# Patient Record
Sex: Female | Born: 1945
Health system: Southern US, Community
[De-identification: ages and names within clinical notes are randomized; demographics above are authoritative.]

## PROBLEM LIST (undated history)

## (undated) DIAGNOSIS — R112 Nausea with vomiting, unspecified: Secondary | ICD-10-CM

## (undated) DIAGNOSIS — R51 Headache: Secondary | ICD-10-CM

## (undated) DIAGNOSIS — K219 Gastro-esophageal reflux disease without esophagitis: Secondary | ICD-10-CM

## (undated) DIAGNOSIS — J45909 Unspecified asthma, uncomplicated: Secondary | ICD-10-CM

## (undated) DIAGNOSIS — Z9889 Other specified postprocedural states: Secondary | ICD-10-CM

## (undated) DIAGNOSIS — I10 Essential (primary) hypertension: Secondary | ICD-10-CM

## (undated) DIAGNOSIS — Z973 Presence of spectacles and contact lenses: Secondary | ICD-10-CM

## (undated) DIAGNOSIS — F32A Depression, unspecified: Secondary | ICD-10-CM

## (undated) DIAGNOSIS — F329 Major depressive disorder, single episode, unspecified: Secondary | ICD-10-CM

## (undated) DIAGNOSIS — M1990 Unspecified osteoarthritis, unspecified site: Secondary | ICD-10-CM

## (undated) DIAGNOSIS — F419 Anxiety disorder, unspecified: Secondary | ICD-10-CM

## (undated) DIAGNOSIS — E039 Hypothyroidism, unspecified: Secondary | ICD-10-CM

## (undated) HISTORY — PX: TONSILLECTOMY: SUR1361

## (undated) HISTORY — PX: APPENDECTOMY: SHX54

## (undated) HISTORY — PX: COLONOSCOPY: SHX174

---

## 1974-11-29 HISTORY — PX: TUBAL LIGATION: SHX77

## 1979-11-30 HISTORY — PX: ABDOMINAL HYSTERECTOMY: SHX81

## 1993-11-29 HISTORY — PX: OVARY SURGERY: SHX727

## 1998-10-10 ENCOUNTER — Other Ambulatory Visit: Admission: RE | Admit: 1998-10-10 | Discharge: 1998-10-10 | Payer: Self-pay | Admitting: Obstetrics & Gynecology

## 1999-08-28 ENCOUNTER — Ambulatory Visit (HOSPITAL_COMMUNITY): Admission: RE | Admit: 1999-08-28 | Discharge: 1999-08-28 | Payer: Self-pay | Admitting: Gastroenterology

## 1999-09-01 ENCOUNTER — Ambulatory Visit (HOSPITAL_COMMUNITY): Admission: RE | Admit: 1999-09-01 | Discharge: 1999-09-01 | Payer: Self-pay | Admitting: Gastroenterology

## 1999-09-01 ENCOUNTER — Encounter: Payer: Self-pay | Admitting: Gastroenterology

## 1999-09-04 ENCOUNTER — Inpatient Hospital Stay (HOSPITAL_COMMUNITY): Admission: AD | Admit: 1999-09-04 | Discharge: 1999-09-06 | Payer: Self-pay | Admitting: Cardiovascular Disease

## 1999-09-04 ENCOUNTER — Encounter: Payer: Self-pay | Admitting: Cardiovascular Disease

## 1999-12-10 ENCOUNTER — Other Ambulatory Visit: Admission: RE | Admit: 1999-12-10 | Discharge: 1999-12-10 | Payer: Self-pay | Admitting: Obstetrics & Gynecology

## 2000-02-22 ENCOUNTER — Encounter (INDEPENDENT_AMBULATORY_CARE_PROVIDER_SITE_OTHER): Payer: Self-pay | Admitting: *Deleted

## 2000-02-22 ENCOUNTER — Ambulatory Visit (HOSPITAL_BASED_OUTPATIENT_CLINIC_OR_DEPARTMENT_OTHER): Admission: RE | Admit: 2000-02-22 | Discharge: 2000-02-22 | Payer: Self-pay | Admitting: Surgery

## 2000-08-23 ENCOUNTER — Ambulatory Visit (HOSPITAL_COMMUNITY): Admission: RE | Admit: 2000-08-23 | Discharge: 2000-08-23 | Payer: Self-pay | Admitting: Neurology

## 2001-01-23 ENCOUNTER — Other Ambulatory Visit: Admission: RE | Admit: 2001-01-23 | Discharge: 2001-01-23 | Payer: Self-pay | Admitting: Obstetrics & Gynecology

## 2001-05-29 ENCOUNTER — Encounter: Admission: RE | Admit: 2001-05-29 | Discharge: 2001-05-29 | Payer: Self-pay | Admitting: Cardiovascular Disease

## 2001-05-29 ENCOUNTER — Encounter: Payer: Self-pay | Admitting: Cardiovascular Disease

## 2001-09-11 ENCOUNTER — Ambulatory Visit (HOSPITAL_COMMUNITY): Admission: RE | Admit: 2001-09-11 | Discharge: 2001-09-11 | Payer: Self-pay | Admitting: Gastroenterology

## 2002-04-06 ENCOUNTER — Encounter: Payer: Self-pay | Admitting: Cardiovascular Disease

## 2002-04-06 ENCOUNTER — Encounter: Admission: RE | Admit: 2002-04-06 | Discharge: 2002-04-06 | Payer: Self-pay | Admitting: Cardiovascular Disease

## 2002-06-26 ENCOUNTER — Encounter: Admission: RE | Admit: 2002-06-26 | Discharge: 2002-06-26 | Payer: Self-pay | Admitting: Cardiovascular Disease

## 2002-06-26 ENCOUNTER — Encounter: Payer: Self-pay | Admitting: Cardiovascular Disease

## 2002-08-20 ENCOUNTER — Other Ambulatory Visit: Admission: RE | Admit: 2002-08-20 | Discharge: 2002-08-20 | Payer: Self-pay | Admitting: Obstetrics & Gynecology

## 2003-01-22 ENCOUNTER — Emergency Department (HOSPITAL_COMMUNITY): Admission: EM | Admit: 2003-01-22 | Discharge: 2003-01-22 | Payer: Self-pay | Admitting: Emergency Medicine

## 2003-01-22 ENCOUNTER — Encounter: Payer: Self-pay | Admitting: Emergency Medicine

## 2003-09-13 ENCOUNTER — Other Ambulatory Visit: Admission: RE | Admit: 2003-09-13 | Discharge: 2003-09-13 | Payer: Self-pay | Admitting: Obstetrics & Gynecology

## 2003-09-18 ENCOUNTER — Ambulatory Visit (HOSPITAL_COMMUNITY): Admission: RE | Admit: 2003-09-18 | Discharge: 2003-09-18 | Payer: Self-pay | Admitting: Gastroenterology

## 2005-04-27 ENCOUNTER — Inpatient Hospital Stay (HOSPITAL_COMMUNITY): Admission: AD | Admit: 2005-04-27 | Discharge: 2005-04-29 | Payer: Self-pay | Admitting: Cardiovascular Disease

## 2005-06-16 ENCOUNTER — Other Ambulatory Visit: Admission: RE | Admit: 2005-06-16 | Discharge: 2005-06-16 | Payer: Self-pay | Admitting: Obstetrics & Gynecology

## 2005-10-15 ENCOUNTER — Encounter: Admission: RE | Admit: 2005-10-15 | Discharge: 2005-10-15 | Payer: Self-pay | Admitting: Neurology

## 2005-10-28 ENCOUNTER — Encounter: Admission: RE | Admit: 2005-10-28 | Discharge: 2005-10-28 | Payer: Self-pay | Admitting: Neurology

## 2007-01-09 ENCOUNTER — Encounter: Admission: RE | Admit: 2007-01-09 | Discharge: 2007-01-09 | Payer: Self-pay | Admitting: Cardiovascular Disease

## 2007-01-10 ENCOUNTER — Ambulatory Visit: Payer: Self-pay | Admitting: *Deleted

## 2007-01-10 ENCOUNTER — Ambulatory Visit (HOSPITAL_COMMUNITY): Admission: RE | Admit: 2007-01-10 | Discharge: 2007-01-10 | Payer: Self-pay | Admitting: Cardiovascular Disease

## 2007-01-16 ENCOUNTER — Inpatient Hospital Stay (HOSPITAL_COMMUNITY): Admission: AD | Admit: 2007-01-16 | Discharge: 2007-01-18 | Payer: Self-pay | Admitting: Cardiovascular Disease

## 2007-03-31 ENCOUNTER — Encounter: Admission: RE | Admit: 2007-03-31 | Discharge: 2007-06-29 | Payer: Self-pay | Admitting: Family Medicine

## 2007-04-04 ENCOUNTER — Ambulatory Visit: Payer: Self-pay | Admitting: Pulmonary Disease

## 2007-05-02 ENCOUNTER — Ambulatory Visit: Payer: Self-pay | Admitting: Pulmonary Disease

## 2008-04-15 ENCOUNTER — Encounter: Admission: RE | Admit: 2008-04-15 | Discharge: 2008-04-15 | Payer: Self-pay | Admitting: Obstetrics & Gynecology

## 2008-08-22 ENCOUNTER — Encounter: Admission: RE | Admit: 2008-08-22 | Discharge: 2008-08-22 | Payer: Self-pay | Admitting: Family Medicine

## 2008-11-29 HISTORY — PX: CHOLECYSTECTOMY: SHX55

## 2008-12-11 ENCOUNTER — Ambulatory Visit (HOSPITAL_COMMUNITY): Admission: RE | Admit: 2008-12-11 | Discharge: 2008-12-12 | Payer: Self-pay | Admitting: Surgery

## 2008-12-11 ENCOUNTER — Encounter (INDEPENDENT_AMBULATORY_CARE_PROVIDER_SITE_OTHER): Payer: Self-pay | Admitting: Surgery

## 2009-02-07 ENCOUNTER — Encounter: Admission: RE | Admit: 2009-02-07 | Discharge: 2009-02-07 | Payer: Self-pay | Admitting: Family Medicine

## 2009-06-29 HISTORY — PX: CYSTOCELE REPAIR: SHX163

## 2009-07-01 ENCOUNTER — Inpatient Hospital Stay (HOSPITAL_COMMUNITY): Admission: RE | Admit: 2009-07-01 | Discharge: 2009-07-04 | Payer: Self-pay | Admitting: Obstetrics & Gynecology

## 2009-07-07 ENCOUNTER — Ambulatory Visit (HOSPITAL_COMMUNITY): Admission: AD | Admit: 2009-07-07 | Discharge: 2009-07-07 | Payer: Self-pay | Admitting: Obstetrics and Gynecology

## 2010-12-20 ENCOUNTER — Encounter: Payer: Self-pay | Admitting: Obstetrics & Gynecology

## 2011-03-06 LAB — BASIC METABOLIC PANEL
BUN: 5 mg/dL — ABNORMAL LOW (ref 6–23)
CO2: 31 mEq/L (ref 19–32)
Calcium: 9.4 mg/dL (ref 8.4–10.5)
Chloride: 90 mEq/L — ABNORMAL LOW (ref 96–112)
Creatinine, Ser: 0.62 mg/dL (ref 0.4–1.2)
GFR calc Af Amer: >60 mL/min (ref >60)
GFR calc non Af Amer: >60 mL/min (ref >60)
Glucose, Bld: 94 mg/dL (ref 70–99)
Potassium: 2.9 mEq/L — ABNORMAL LOW (ref 3.5–5.1)
Sodium: 128 mEq/L — ABNORMAL LOW (ref 135–145)

## 2011-03-06 LAB — CBC
HCT: 29.3 % — ABNORMAL LOW (ref 36.0–46.0)
HCT: 30 % — ABNORMAL LOW (ref 36.0–46.0)
HCT: 43.1 % (ref 36.0–46.0)
Hemoglobin: 10.4 g/dL — ABNORMAL LOW (ref 12.0–15.0)
Hemoglobin: 10.4 g/dL — ABNORMAL LOW (ref 12.0–15.0)
Hemoglobin: 14.9 g/dL (ref 12.0–15.0)
MCHC: 34.5 g/dL (ref 30.0–36.0)
MCHC: 34.8 g/dL (ref 30.0–36.0)
MCHC: 35.5 g/dL (ref 30.0–36.0)
MCV: 90.1 fL (ref 78.0–100.0)
MCV: 90.7 fL (ref 78.0–100.0)
MCV: 91.5 fL (ref 78.0–100.0)
Platelets: 247 10*3/uL (ref 150–400)
Platelets: 252 10*3/uL (ref 150–400)
Platelets: 342 10*3/uL (ref 150–400)
RBC: 3.22 MIL/uL — ABNORMAL LOW (ref 3.87–5.11)
RBC: 3.27 MIL/uL — ABNORMAL LOW (ref 3.87–5.11)
RBC: 4.79 MIL/uL (ref 3.87–5.11)
RDW: 12.4 % (ref 11.5–15.5)
RDW: 12.6 % (ref 11.5–15.5)
RDW: 12.6 % (ref 11.5–15.5)
WBC: 6.7 10*3/uL (ref 4.0–10.5)
WBC: 8.6 10*3/uL (ref 4.0–10.5)
WBC: 9.6 10*3/uL (ref 4.0–10.5)

## 2011-03-06 LAB — PROTIME-INR
INR: 0.9 (ref 0.00–1.49)
Prothrombin Time: 12.4 seconds (ref 11.6–15.2)

## 2011-03-06 LAB — APTT: aPTT: 34 seconds (ref 24–37)

## 2011-03-15 LAB — DIFFERENTIAL
Basophils Absolute: 0.1 10*3/uL (ref 0.0–0.1)
Basophils Relative: 2 % — ABNORMAL HIGH (ref 0–1)
Eosinophils Absolute: 0 10*3/uL (ref 0.0–0.7)
Eosinophils Relative: 0 % (ref 0–5)
Lymphocytes Relative: 25 % (ref 12–46)
Lymphs Abs: 1.5 10*3/uL (ref 0.7–4.0)
Monocytes Absolute: 0.7 10*3/uL (ref 0.1–1.0)
Monocytes Relative: 11 % (ref 3–12)
Neutro Abs: 3.7 10*3/uL (ref 1.7–7.7)
Neutrophils Relative %: 63 % (ref 43–77)

## 2011-03-15 LAB — COMPREHENSIVE METABOLIC PANEL
ALT: 14 U/L (ref 0–35)
AST: 21 U/L (ref 0–37)
Albumin: 3.8 g/dL (ref 3.5–5.2)
Alkaline Phosphatase: 76 U/L (ref 39–117)
BUN: 10 mg/dL (ref 6–23)
CO2: 30 mEq/L (ref 19–32)
Calcium: 8.9 mg/dL (ref 8.4–10.5)
Chloride: 93 mEq/L — ABNORMAL LOW (ref 96–112)
Creatinine, Ser: 0.61 mg/dL (ref 0.4–1.2)
GFR calc Af Amer: >60 mL/min (ref >60)
GFR calc non Af Amer: >60 mL/min (ref >60)
Glucose, Bld: 92 mg/dL (ref 70–99)
Potassium: 3.2 mEq/L — ABNORMAL LOW (ref 3.5–5.1)
Sodium: 132 mEq/L — ABNORMAL LOW (ref 135–145)
Total Bilirubin: 0.4 mg/dL (ref 0.3–1.2)
Total Protein: 7.2 g/dL (ref 6.0–8.3)

## 2011-03-15 LAB — URINALYSIS, ROUTINE W REFLEX MICROSCOPIC
Bilirubin Urine: NEGATIVE
Glucose, UA: NEGATIVE mg/dL
Hgb urine dipstick: NEGATIVE
Ketones, ur: NEGATIVE mg/dL
Nitrite: NEGATIVE
Protein, ur: NEGATIVE mg/dL
Specific Gravity, Urine: 1.009 (ref 1.005–1.030)
Urobilinogen, UA: 0.2 mg/dL (ref 0.0–1.0)
pH: 7 (ref 5.0–8.0)

## 2011-03-15 LAB — LIPASE, BLOOD: Lipase: 31 U/L (ref 11–59)

## 2011-03-15 LAB — CBC
HCT: 38.9 % (ref 36.0–46.0)
Hemoglobin: 13.3 g/dL (ref 12.0–15.0)
MCHC: 34.2 g/dL (ref 30.0–36.0)
MCV: 88.2 fL (ref 78.0–100.0)
Platelets: 317 10*3/uL (ref 150–400)
RBC: 4.41 MIL/uL (ref 3.87–5.11)
RDW: 12.6 % (ref 11.5–15.5)
WBC: 6 10*3/uL (ref 4.0–10.5)

## 2011-04-13 NOTE — Op Note (Signed)
NAMEADISA, VIGEANT               ACCOUNT NO.:  [id number]   MEDICAL RECORD NO.:  [id number]          PATIENT TYPE:  OIB   LOCATION:  9319                          FACILITY:  [facility]   PHYSICIAN:  Freddy Finner, M.D.   DATE OF BIRTH:  [birth year]   DATE OF PROCEDURE:  07/01/2009  DATE OF DISCHARGE:                               OPERATIVE REPORT   PREOPERATIVE DIAGNOSES:  1. Third-degree cystocele.  2. Second-degree rectocele.  3. Marked vaginal vault descensus   POSTOPERATIVE DIAGNOSES:  1. Third-degree cystocele.  2. Second-degree rectocele.  3. Marked vaginal vault descensus   OPERATIVE PROCEDURE:  Anterior and posterior vaginal repair,  sacrospinous ligament suspension of vagina, suprapubic catheter.   SURGEON:  Freddy Finner, M.D.   ASSISTANT:  Dineen Kid. Rana Snare, M.D.   ANESTHESIA:  General endotracheal.   INTRAOPERATIVE COMPLICATIONS:  None.   ESTIMATED INTRAOPERATIVE BLOOD LOSS:  Less than 100 mL.   DETAILS OF THE PRESENT ILLNESS:  According to the admission note, the  patient was admitted on the morning of surgery.  She was placed in PAS  hose.  She was given a bolus of Ancef.  She was brought to the operating  room and there placed adequate general endotracheal anesthesia, placed  in dorsal lithotomy position using the Allen stirrup system.  Betadine  prep of lower abdomen, perineum and vagina was carried out in the usual  fashion.  Sterile drapes were applied.  Posterior weighted vaginal  retractor was placed.  The apex of the vagina anteriorly was grasped  with an Allis.  Mucosa of the anterior vagina was tented with 2 Allis',  and a linear incision made in the midline.  The edges of the incision  were grasped with Allis', and with careful blunt and sharp dissection,  the bladder was freed from the urogenital fascia.  This was carried to  approximately 1 cm from the urethral meatus, after carefully dissecting  the bladder and urethral free, plication sutures of 0  Monocryl were used  to elevate the urethrovesical angle and to reduce the cystocele.  Urethral patency was confirmed with a Tresa Endo as well as urethral length  which was approximately 2.5 cm.  Segments of mucosa were excised and the  vaginal incision closed with a running locking 2-0 Monocryl suture.  Attention was turned posteriorly.  Fascia was grasped on either side  with an Allis.  A pyramidal-shaped segment of skin was excised from the  perineum apex inferiorly.  The mucosa overlying the rectum was then  tented with Allis' and the rectum carefully separated from the vaginal  mucosa, and a linear incision made along the posterior wall in the  midline.  After carefully dissecting the rectum free, perirectal tissue  was approximated in the midline to reduce the cystocele.  The  uterosacral suture of 0 Dexon was anchored to the right uterosacral in  the mid point with the Capio device.  After partially closing the  posterior mucosa, the suture which had been attached to the upper vagina  was tied which adequately elevated the vaginal vault.  The closure was  completed in a fashion similar to episiotomy.  The bladder was filled  with 300 mL of sterile solution and banana catheter inserted without  difficulty and anchored to the skin with silk sutures.  Bladder was  evacuated.  Suprapubic was attached to the collection system.  The  patient was awakened and taken to recovery in good condition.      Freddy Finner, M.D.  Electronically Signed     WRN/MEDQ  D:  07/01/2009  T:  07/01/2009  Job:  [id number]

## 2011-04-13 NOTE — H&P (Signed)
NAMELAVONNA, LAMPRON               ACCOUNT NO.:  [id number]   MEDICAL RECORD NO.:  [id number]          PATIENT TYPE:  AMB   LOCATION:  SDC                           FACILITY:  [facility]   PHYSICIAN:  Freddy Finner, M.D.   DATE OF BIRTH:  1946/07/24   DATE OF ADMISSION:  DATE OF DISCHARGE:                              HISTORY & PHYSICAL   ADMITTING DIAGNOSES:  Third-degree cystocele, second-degree rectocele,  vaginal vault prolapse.   The patient is a 65 year old, white, married female, gravida 1, para 1,  who has been followed in my office for a number of years.  She is  admitted at this time because of symptomatic pelvic relaxation with a  third-degree cystocele, second-degree rectocele, and marked descensus of  the vaginal vault.  Until approximately of 6 months or so ago, the  patient had been known to have a second-degree cystocele that presented  in early July of this year complaining of marked increase in vaginal  pressure and a feeling that something was falling out.  She also has  urinary urgency and episodes of stress urinary incontinence.  She did  complain of incontinence and for that reason had cystometrics, which  really was compromised a bit by the marked prolapse of the bladder, but  she was demonstrated to have stress urinary incontinence with full  bladder capacity.  She has been counseled regarding the presence of urge  incontinence with bladder spasm and detrusor instability more than  likely and the stress component.  In addition to these symptoms, the  prolapse is very uncomfortable.  She has requested definitive surgical  intervention understanding that it may not have any positive effect on  the detrusor instability.  She is admitted now for anterior and  posterior colporrhaphy and sacrospinous ligament suspension of the  vagina.   CURRENT REVIEW OF SYSTEMS:  Otherwise without significant complaints.  She denies CARDIOPULMONARY, GI, or GU symptoms.   FAMILY  HISTORY:  Noncontributory.   PAST MEDICAL HISTORY:  1. The patient is known to be hypothyroid.  For this, she takes 0.01      mg of Synthroid per day.  2. She is also known to be hypertensive for which she takes      hydrochlorothiazide 25 mg daily.  3. She also is known to have depression, which is well-controlled with      Effexor Extended Release 3 capsules a day.  4. She has esophageal reflux for which she takes Protonix 1 a day.  5. She takes Vivelle 0.1 transdermal estrogen patch for menopausal      symptoms.  She has been treated perioperatively with Estradiol      vaginal cream every other day.   The potential risks of the procedure including injury to bowel and/or  bladder, injury to vessels, the potential for blood clots in her legs,  and infection have been discussed with the patient.  She is now admitted  and prepared to proceed with surgery.   PHYSICAL EXAMINATION:  HEENT:  Grossly within normal limits.  NECK:  Thyroid gland is not palpably enlarged.  VITAL  SIGNS:  Blood pressure in the office was 112/72.  The patient's  weight is 136, height 5 feet 3.5 inches.  GENERAL:  The patient is alert and oriented and cooperative.  CHEST:  Clear to auscultation throughout.  HEART:  Normal sinus rhythm without murmurs, rubs, or gallops.  BREAST EXAM:  Considered to be normal, no palpable masses, no nipple  discharge, no skin change or skin retraction.  ABDOMEN:  Soft and nontender without appreciable organomegaly.  PELVIC EXAMINATION:  Remarkable for prolapse of the bladder with  Valsalva and prolapse of the rectocele to the introitus with Valsalva.  Bimanual exam reveals no palpable adnexal or pelvic masses.  The rectum  is palpably normal and this confirms the vaginal findings.  EXTREMITIES:  Without cyanosis, clubbing, or edema.   ASSESSMENT:  Symptomatic bladder prolapse and pelvic relaxation.   PLAN:  Anterior and posterior colporrhaphy, sacrospinous ligament  suspension  of the vagina, intraoperative serial equal compression  devices to the lower extremities, preoperative antibiotic.      Freddy Finner, M.D.  Electronically Signed     WRN/MEDQ  D:  06/30/2009  T:  06/30/2009  Job:  [id number]

## 2011-04-13 NOTE — Op Note (Signed)
[name], [name]               ACCOUNT NO.:  [id number]   MEDICAL RECORD NO.:  [id number]          PATIENT TYPE:  AMB   LOCATION:  DAY                          FACILITY:  [facility]   PHYSICIAN:  Currie Paris, M.D.DATE OF BIRTH:  Apr 16, 1946   DATE OF PROCEDURE:  12/11/2008  DATE OF DISCHARGE:                               OPERATIVE REPORT   PREOPERATIVE DIAGNOSIS:  Biliary dyskinesia.   POSTOPERATIVE DIAGNOSIS:  Biliary dyskinesia.   OPERATION:  Laparoscopic cholecystectomy with operative cholangiogram.   SURGEON:  Currie Paris, MD   ASSISTANT:  Angelia Mould. Derrell Lolling, MD   ANESTHESIA:  General endotracheal.   CLINICAL HISTORY:  This is a 65 year old lady with biliary type symptoms  that have been going on for quite a while.  Recently, a hepatobiliary  scan showed delayed emptying and it was thought biliary dyskinesia was  contributing to her majority of her symptoms.  She elected to proceed to  cholecystectomy.   DESCRIPTION OF PROCEDURE:  The patient was seen in the holding area and  she had no further questions.  We confirmed cholecystectomy as planned  procedure and she understood the indications, risks, and complications.   The patient was taken to the operating room and after satisfactory  general endotracheal anesthesia had been obtained, the abdomen was  prepped and draped.  A time-out was done.   A 0.25% plain Marcaine was used for each incision.  The umbilical  incision was made, the fascia opened, and the peritoneal cavity entered.  The Hasson was placed and the abdomen insufflated to 15.   The camera was placed.  There were no obvious abnormalities within the  peritoneal cavity.  The patient was placed in reverse Trendelenburg and  tilted to left.  A 10-11 trocar was placed in the epigastrium and two 5  mm in the right upper quadrant laterally.   The gallbladder was retracted over the liver.  The peritoneum and the  area of the triangle of Calot was  opened and I made a nice window and  could see a long segment of cystic duct and cystic artery and had those  well exposed.  A single clip was placed on the duct at the junction with  the gallbladder and one on the artery.  The cystic duct was opened and  operative cholangiography was done with a Cook catheter.   This looked normal.  The catheter was removed and 3 clips were placed on  the stay side of the cystic duct and 2 on the stay side of the artery  and both were divided.  The gallbladder was removed from below to above  with a small vessel along the edge of the gallbladder high up, clipped.   Once the gallbladder was disconnected, I made sure everything was dry.  The gallbladder was brought out of the umbilical site.  The abdomen was  reinsufflated.  We irrigated and made a final check and everything  appeared to be dry.  There is no evidence of bleeding or bile leak.   Lateral ports were removed under direct vision.  There was  no bleeding.  The umbilical site was closed with a purse-string.  The abdomen was  deflated through the epigastric port.  Skin was closed with 4-0 Monocryl  subcuticular and Dermabond.   The patient tolerated the procedure well and there were no  complications.      Currie Paris, M.D.  Electronically Signed     CJS/MEDQ  D:  12/11/2008  T:  12/12/2008  Job:  [id number]   cc:   Chales Salmon. Abigail Miyamoto, M.D.

## 2011-04-13 NOTE — Assessment & Plan Note (Signed)
[facility]                             PULMONARY OFFICE NOTE   NAME:Osmer, ALEXI DORMINEY                      MRN:          [id number]  DATE:05/02/2007                            DOB:          26-Oct-1946    SUBJECTIVE:  Ms. Deguzman comes in today after her trial of Symbicort  for probable occult asthma.  She does feel that her breathing is some  better but continues to complain of dyspnea on exertion and is not  satisfied overall with her quality of life.  She has had a very  extensive cardiopulmonary work up with nothing being found.  She has no  significant cough or mucous production.   PHYSICAL EXAMINATION:  VITAL SIGNS:  Blood pressure is 102/70, pulse 89,  temperature 97.9, weight 145 pounds.  Oxygen saturation on room air is  97%.  GENERAL APPEARANCE:  She is a well-developed white female in no acute  distress.   IMPRESSION:  Dyspnea on exertion that may be partially due to occult  asthma, however, her symptoms are way out of proportion to any of her  objective findings.  She has had a very good cardiopulmonary work up  except for an echocardiogram to rule out possibility of pulmonary artery  hypertension.  I do think this needs to be done and if it is negative  that she needs to work aggressively on weight loss and a conditioning  program.  The patient is not totally satisfied with the results of this,  but I have told her that it is sometimes better to know what she doesn't  have than to know what she does have.  I will continue to leave the  door open and if she does not respond in a positive way to an exercise  program over the next eight weeks as well as weight loss, we can re-  evaluate.  I do think she needs to continue on her Symbicort but at a  lower strength for her occult obstructive lung disease.   PLAN:  1. Continue on Symbicort but at the 80/4.5 strength two b.i.d.  2. Echocardiogram with Dr. Orpah Cobb to rule out pulmonary  artery      hypertension.  If this is negative, she needs to work aggressively      on weight loss and a consistent exercise program over the next 8      weeks.  I have asked her to really push herself in order to build      conditioning.  3. Patient will follow up if she continues to have dyspnea despite      being compliant on an exercise program and losing some weight.     Barbaraann Share, MD,FCCP  Electronically Signed    KMC/MedQ  DD: 05/02/2007  DT: 05/02/2007  Job #: [id number]   cc:   Chales Salmon. Abigail Miyamoto, M.D.  Ricki Rodriguez, M.D.

## 2011-04-16 NOTE — Procedures (Signed)
[facility]  Patient:    [name], [name] Visit Number: [id number] MRN: [id number]          Service Type: END Location: ENDO Attending Physician:  Louie Bun Dictated by:   Everardo All Madilyn Fireman, M.D. Proc. Date: 09/11/01 Admit Date:  09/11/2001   CC:         Chales Salmon. Abigail Miyamoto, M.D.                           Procedure Report  PROCEDURE PERFORMED:  Esophagogastroduodenoscopy with esophageal dilatation.  INDICATION FOR PROCEDURE:  Dysphagia mainly for meats suggestive of an esophageal ring, web or stricture.  DESCRIPTION OF PROCEDURE:  The patient was placed in the left lateral decubitus position and placed on the pulse monitor with continuous low flow oxygen delivered by nasal cannula.  She was sedated with 80 mg IV Demerol and 7 mg of IV Versed.  The Olympus video endoscope was advanced under direct vision into the oropharynx and esophagus.  The esophagus was straight and of normal caliber with the squamocolumnar line at 38 cm above a 1 cm sliding hiatal hernia.  No ring, stricture or web was appreciated.  There was no esophagitis or other abnormality at the distal esophagus or GE junction.  The stomach was entered and a small amount of liquid secretions were suctioned from the fundus.  Retroflexed view of the cardia was unremarkable.  The fundus, body, antrum and pylorus all appeared normal.  The duodenum was entered and both bulb and second portion were well inspected and appeared to be within normal limits.  The [facility] guide wire was placed through the endoscope channel and the scope withdrawn.  A single 17 mm Savory dilator was passed over the scope to mild resistance with no blood noted on withdraw.  The patient was then returned to the recovery room in stable condition.  She tolerated the procedure well and there were no immediate complications.  IMPRESSION: Small hiatal hernia with no visible ring or stricture appreciated, status post  empiric dilatation to 17 mm.  PLAN:  Advance diet and observe response to dilatation. Dictated by:   Everardo All Madilyn Fireman, M.D. Attending Physician:  Louie Bun DD:  09/11/01 TD:  09/11/01 Job: [id number]

## 2011-04-16 NOTE — Assessment & Plan Note (Signed)
[facility]                             PULMONARY OFFICE NOTE   NAME:Vanosdol, Safiatou M                      MRN:          [id number]  DATE:04/04/2007                            DOB:          1946-05-26    PULMONARY CONSULTATION:   HISTORY OF PRESENT ILLNESS:  The patient is a 65 year old female who is  being referred for evaluation of dyspnea.  The patient states that she  has had some shortness of breath in the past, but it has been quite  mild, and only became an issue whenever she became sick.  Starting in  September, the patient began to notice increasing shortness of breath  and it sounds like she may have been placed on Spiriva at that time.  The patient really is very confused about the timeline.  The patient  thought that she may have gotten better and then began to worsen.  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  In February of this year, she had an acute worsening of her shortness of  breath to the point that she was basically nonfunctional.  She was  admitted to the hospital under the care of Dr. Algie Coffer and underwent a  very extensive cardiopulmonary workup.  The discharge summary is not  available, but I do have the results of a lot of her testing.  The  patient was found to have a room air blood gas with a PO2 of 82, PCO2 of  41, pH of 7.46, which is normal for her.  She had a clear chest x-ray  and a totally normal CT of the chest with no evidence of pulmonary  emboli.  She underwent a pharmacologic stress Cardiolite that was  totally within normal limits with an ejection fraction of 73%.  She  underwent pulmonary function studies that showed a forced vital capacity  that was 91% of predicted and an FEV-1 that was 99% of predicted.  Her  FEV-1 to FVC ratio was totally normal as well.  The only suggestion that  there may be  slight obstructive lung disease present is that she had a  15% change in FVC with bronchodilators.  The patient states currently  she has approximately 1-block dyspnea on exertion on a flat ground at  moderate pace.  She will get very winded vacuuming 1 room of her house  or bringing in 1 bag of groceries from the car.  She has a very mild  dry, hacky cough and feels tight in her chest in the mornings whenever  she arises.  She does have a history of GERD, which has been better  since being on Protonix.  The patient has been away from her work  environment and has not seen any difference in her breathing or exercise  tolerance.  Of note, her weight has increased about 20 pounds over the  last 2 years.   PAST MEDICAL HISTORY:  1. Dyslipidemia.  2. History of questionable TIA.  3. History  of chronic headaches.  4. Status post hysterectomy and tubal ligation.  5. History of depression.  6. History of anxiety with acute attacks.   CURRENT MEDICATIONS:  1. Keppra 500 mg three b.i.d.  2. Synthroid 0.122 mg daily.  3. Hydrochlorothiazide 25 mg daily.  4. Effexor XR 300 mg daily.  5. Norvasc 2.5 mg daily.  6. Protonix 40 mg b.i.d.  7. Advair 100/50 one daily.  8. Spiriva one p.o. daily.   INTOLERANCE/ALLERGY:  DOXYCYCLINE.   SOCIAL HISTORY:  She has a history of smoking up to 2 packs per day for  20 years.  She has not smoked in 17 years.  She is married and has  children.  She lives with her spouse.   FAMILY HISTORY:  Remarkable for father having emphysema, otherwise is  noncontributory.   REVIEW OF SYSTEMS:  As per history of present illness, also see patient  intake form documented in the chart.   PHYSICAL EXAMINATION:  GENERAL:  She is a mildly overweight white female  in no acute distress.  Blood pressure is 112/78, pulse 86, temperature is 97.9 and weight is  146 pounds.  O2 saturation on room air is 96%.  HEENT:  Pupils equal, round and reactive to light and  accommodation.  Extraocular muscles are intact.  Nares are patent without discharge.  Oropharynx is clear.  NECK:  Supple without JVD or lymphadenopathy.  There is no palpable  thyromegaly.  CHEST:  Totally clear to auscultation, except for very mild decrease in  air flow.  CARDIAC:  Regular rate and rhythm.  No murmurs, rubs, or gallops.  ABDOMEN:  Soft and nontender with good bowel sounds.  GENITAL:  Not done and not indicated.  RECTAL:  Not done and not indicated.  BREASTS:  Not done and not indicated.  LOWER EXTREMITIES:  Without edema.  Pulses are intact distally.  NEUROLOGICAL:  She is alert and oriented with no obvious motor deficits.   IMPRESSION:  1. Significant dyspnea on exertion, which is way out of proportion to      all other objective findings.  The patient has had a fairly      extensive workup from a heart standpoint, and has a negative chest      x-ray, CT scan and fairly unrevealing pulmonary function studies.      However, lung volumes and diffusion capacity were not done.  I      would wonder, based on the patient's history and the fact that her      spirometry is basically normal except for a 15% improvement in [facility],      if this may be due to asthma.  There is no evidence of fixed      airflow obstruction on her current pulmonary function tests and      therefore she does not have emphysema/chronic obstructive pulmonary      disease.  I am more than willing to give her a try of a different      bronchodilator regimen because I question her compliance on the      Advair.  She is complaining of the way it makes her feel and      irritates the back of her throat.  I will go ahead and change her      over to Symbicort, but if she does not see an improvement on this      medication, I suspect that her dyspnea is probably related to  weight and conditioning.  The only other possibility to consider is     whether she could have pulmonary hypertension, since she has  not      had an echocardiogram done.   PLAN:  1. We will discontinue Spiriva as well as Advair and start her      Symbicort 160/4.5 two puffs b.i.d.  2. The patient will follow up in 4 weeks or sooner if there are      problems.  3. If she continues to have dyspnea without improvement, I would      consider echocardiogram to rule out elevated pulmonary artery      pressures.  If this is normal, I suspect this is a perception of      dyspnea as well as a weight and conditioning issue.     Barbaraann Share, MD,FCCP  Electronically Signed    [facility]/MedQ  DD: 04/04/2007  DT: 04/05/2007  Job #: [id number]   cc:   Chales Salmon. Abigail Miyamoto, M.D.  Ricki Rodriguez, M.D.

## 2011-04-16 NOTE — Op Note (Signed)
   NAME:  [name] [name]                         ACCOUNT NO.:  [id number]   MEDICAL RECORD NO.:  [id number]                   PATIENT TYPE:  AMB   LOCATION:  ENDO                                 FACILITY:  [facility]   PHYSICIAN:  John C. Madilyn Fireman, M.D.                 DATE OF BIRTH:  01/29/1946   DATE OF PROCEDURE:  09/18/2003  DATE OF DISCHARGE:                                 OPERATIVE REPORT   PROCEDURE:  Colonoscopy.   INDICATIONS FOR PROCEDURE:  Family history of colon cancer.  Last  colonoscopy five years ago.   DESCRIPTION OF PROCEDURE:  The patient was placed in the left lateral  decubitus position and placed on the pulse monitor with continuous low-flow  oxygen delivered by nasal cannula.  She was sedated with 50 mcg IV fentanyl,  6 mg IV Versed for the previous EGD and then given an additional 25 mcg IV  fentanyl  and 4 mg Versed for the colonoscopy.  The Olympus video  colonoscope was inserted into the rectum and advanced to the cecum,  confirmed by transillumination of McBurney's point and visualization of the  ileocecal valve and appendiceal orifice.  Prep was excellent.  The cecum,  ascending, transverse, descending, and sigmoid colon all appeared normal and  retroflexed view of the anus revealed no obvious internal hemorrhoids.  The  scope was then withdrawn and the patient returned to the recovery room in  stable condition.  She tolerated the procedure well and there were no  immediate complications.   IMPRESSION:  Normal colonoscopy.   PLAN:  Repeat study in five years.                                                John C. Madilyn Fireman, M.D.    JCH/MEDQ  D:  09/18/2003  T:  09/18/2003  Job:  [id number]

## 2011-04-16 NOTE — H&P (Signed)
[name], [name]               ACCOUNT NO.:  [id number]   MEDICAL RECORD NO.:  [id number]          PATIENT TYPE:  INP   LOCATION:  2019                         FACILITY:  [facility]   PHYSICIAN:  Ricki Rodriguez, M.D.  DATE OF BIRTH:  March 27, 1946   DATE OF ADMISSION:  04/27/2005  DATE OF DISCHARGE:                                HISTORY & PHYSICAL   CHIEF COMPLAINT:  Chest pain.   HISTORY OF PRESENT ILLNESS:  This is a 65 year old white female who chest  pain in less than 2 minutes of a treadmill stress-test with 1 mm ST  depressions in inferior leads. The patient had a decreased exercise capacity  for the last 1-2 months.   PAST MEDICAL HISTORY:  1.  Negative for diabetes, hypertension.  2.  Remote history of smoking but no history of alcohol intake, no history      of cholesterol evaluation, myocardial infarction or obesity and no      history of premature coronary artery disease.   PAST SURGICAL HISTORY:  Hysterectomy in 1981; tonsillectomy at age 53;  appendectomy at age 45; tubal ligation in 2; ovary removed in 1995.   CURRENT MEDICATIONS:  1.  Effexor 225 mg 1 daily.  2.  Captopril 400 mg 3 b.i.d.  3.  Calcium 600 mg two daily.  4.  Synthroid 0.112 mg 1 daily.  5.  Nexium 40 mg 1 daily.  6.  Aspirin 81 mg 1 daily.  7.  Vitamin E 400 international units 1 daily.  8.  Altace 2.5 mg 1 daily.   ALLERGIES:  ANSAID, CELEBREX.   SOCIAL HISTORY:  The patient is married. She has one daughter. 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   FAMILY HISTORY:  Father died of cirrhosis of the liver and alcohol use. One  brother 31 years old and healthy. No sisters.   REVIEW OF SYSTEMS:  The patient wears reading glasses. Has history of COPD  and pneumonias, also has history of chest pain, nausea, vomiting,  constipation, stomach ulcers, hiatal hernia, hepatitis and joint pains. No  history of weight gain, weight loss, hearing loss, tinnitus, cough and cold,  hemoptysis, asthma, palpitations, dizziness, GI bleed, blood transfusion,  kidney stone, stroke, seizures, psychiatric admissions, or skin rash.   PHYSICAL EXAMINATION:  VITAL SIGNS: Pulse 80, respiration 18, blood pressure  130/80. Height 5 feet, 4 inches, weight 144 pounds.  HEENT:  The patient is normocephalic, atraumatic. Eyes are brown, pupils are  reactive. Extraocular movements are intact.  NECK:  No jugular venous distension or carotid bruit.  LUNGS:  Clear except for forced expiratory, mild wheezing.  HEART:  Normal S1, S2 with a grade 2/6 systolic murmur.  ABDOMEN:  Soft, nontender.  EXTREMITIES: No clubbing, cyanosis or edema. Peripheral pulses are 2+.  CNS:  Grossly intact.   LABORATORY DATA:  Normal hemoglobin and hematocrit, WBC, platelet count.  Normal INR. Sodium low at 128, potassium 3.3, chloride 90, BUN 5, creatinine  0.8. CK-MB, troponin I normal x1.  EKG normal sinus rhythm with nonspecific ST, T changes.  Chest x-ray - no  acute process.   IMPRESSION:  1.  Chest pain, rule out myocardial infarction.  2.  Hypothyroidism.  3.  Anxiety and depression.  4.  Coronary artery disease.   PLAN:  Admit the patient to a telemetry unit. Give intravenous fluids to  correct electrolyte imbalance. Consider cardiac catheterization. The patient  understood the risk, benefit and alternatives.       ASK/MEDQ  D:  04/27/2005  T:  04/28/2005  Job:  [id number]

## 2011-04-16 NOTE — H&P (Signed)
NAMEMATTELYN, IMHOFF               ACCOUNT NO.:  [id number]   MEDICAL RECORD NO.:  [id number]          PATIENT TYPE:  INP   LOCATION:  2622                         FACILITY:  [facility]   PHYSICIAN:  Ricki Rodriguez, M.D.  DATE OF BIRTH:  1946/08/25   DATE OF ADMISSION:  01/16/2007  DATE OF DISCHARGE:                              HISTORY & PHYSICAL   CHIEF COMPLAINT:  Shortness of breath.   HISTORY OF PRESENT ILLNESS:  This 65 year old white female complains of  progressively worsening shortness of breath, most of it during exertion  without significant chest discomfort.  Patient works for a [employer] and used to smoke a long time ago, but has quit for many years.   PAST MEDICAL HISTORY:  Negative for diabetes, hypertension.  Quit  smoking 20 years ago.  No history of alcohol intake.  No history of  cholesterol elevation.  No history of myocardial infarction, obesity or  premature coronary artery disease in the family.   PAST SURGICAL HISTORY:  Include:  1. Hysterectomy in 1981.  2. Tonsillectomy at age 66.  3. Appendectomy at age 30.  4. Tubal ligation in 69 with ovary removal in 1995.  5. Cardiac catheterization in 2000 and 2006.   CURRENT MEDICATIONS:  Include:  1. Effexor 300 mg 1 daily.  2. Keppra 500 mg 3 tablets twice daily.  3. Calcium 600 mg 2 daily.  4. Hydrochlorothiazide 25 mg 1 daily.  5. Synthroid 0.112 mg 1 daily.  6. Nexium 40 mg 1 daily.  7. Norvasc 2.5 mg 1 daily.  8. Vitamin E 400 IU 1 daily.  9. Aspirin 81 mg 1 daily.   ALLERGIES:  1. CELEBREX.  2. NSAIDS.   SOCIAL HISTORY:  Patient is married for 30+ years.  Has 1 daughter.  She  has been self-employed, doing cleaning houses and offices.   REVIEW OF SYSTEMS:  Patient denies any sudden weight gain or weight  loss.  She wears reading glasses.  Has a history of COPD and pneumonia.  Also has history of chest pain, nausea, vomiting, constipation, stomach  ulcers, hiatal hernia, hepatitis and  joint pain.  Patient denies any  history of hearing loss, tinnitus, hemoptysis, asthma, palpitations,  dizziness, GI bleed, blood transfusion, kidney stones, stroke, seizures,  psychiatric admissions or skin rash.   PHYSICAL EXAMINATION:  VITAL SIGNS:  Pulse 95.  Respirations 18.  Blood  pressure 140/70.  HEENT:  Patient is normocephalic, atraumatic.  Eyes are brown.  Pupils  reacting.  Extraocular movement intact.  NECK:  No JVD.  No carotid bruit.  LUNGS:  Clear, but expiration prolonged.  HEART:  Normal S1, S2 with a grade 2/6 systolic murmur.  ABDOMEN:  Soft, nontender.  EXTREMITIES:  No edema, cyanosis or clubbing.  Peripheral pulses 2+.  CNS:  Cranial nerves grossly intact.  Patient moves all 4 extremities.   LABORATORY DATA:  Pending.   CT of the chest pending.   IMPRESSION:  1. Shortness of breath, rule out pulmonary embolism.  2. Rule out coronary artery disease.  3. Hypothyroidism.  4. Anxiety.  5. Chronic  obstructive lung disease.   PLAN:  Admit patient to telemetry unit.  Start subcu Lovenox.  Get CT of  the chest.  Check cardiac enzymes.  Patient's recent pulmonary function  tests and chest x-rays were negative for severe chronic obstructive  pulmonary disease.      Ricki Rodriguez, M.D.  Electronically Signed     ASK/MEDQ  D:  01/16/2007  T:  01/17/2007  Job:  [id number]

## 2011-04-16 NOTE — Op Note (Signed)
   NAME:  [name], [name]                         ACCOUNT NO.:  [id number]   MEDICAL RECORD NO.:  [id number]                   PATIENT TYPE:  AMB   LOCATION:  ENDO                                 FACILITY:  [facility]   PHYSICIAN:  John C. Madilyn Fireman, M.D.                 DATE OF BIRTH:  [birth year]   DATE OF PROCEDURE:  09/18/2003  DATE OF DISCHARGE:                                 OPERATIVE REPORT   PROCEDURE:  Esophagogastroduodenoscopy.   INDICATIONS:  The patient undergoing surveillance colonoscopy who has had a  lot of increased trouble with gastroesophageal reflux despite taking a  proton pump inhibitor.   DESCRIPTION OF PROCEDURE:  The patient was placed in the left lateral  decubitus position and placed on the pulse monitor with continuous low-flow  oxygen delivered by nasal cannula.  She was sedated with 50 mcg IV fentanyl  and 6 mg IV Versed.  The Olympus video endoscope was advanced under direct  vision into the oropharynx and esophagus.  The esophagus was straight and of  normal caliber, the squamocolumnar line at 36 cm above a 1.5-2 cm hiatal  hernia.  There was no ring, stricture, esophagitis, or other abnormalities  at GE junction.  The stomach was entered and a small amount of liquid  secretions were suctioned from the fundus.  Retroflexed view of the cardia  confirmed the hiatal hernia and was otherwise unremarkable.  The fundus,  body, antrum and pylorus all appeared normal.  The duodenum was entered and  both bulb and second portion were well inspected appeared to be within  normal limits. The scope was then withdrawn and the patient returned to the  recovery room in stable condition.  She tolerated the procedure well and  there were no immediate complications.   IMPRESSION:  Small to moderate hiatal hernia; otherwise normal study.   PLAN:  Will proceed with colonoscopy and increase Nexium to b.i.d. dosing.                                               John C. Madilyn Fireman,  M.D.    JCH/MEDQ  D:  09/18/2003  T:  09/18/2003  Job:  [id number]

## 2011-04-16 NOTE — Discharge Summary (Signed)
[name], [name]               ACCOUNT NO.:  [id number]   MEDICAL RECORD NO.:  [id number]          PATIENT TYPE:  INP   LOCATION:  2622                         FACILITY:  [facility]   PHYSICIAN:  Ricki Rodriguez, M.D.  DATE OF BIRTH:  [birth year]   DATE OF ADMISSION:  01/16/2007  DATE OF DISCHARGE:  01/18/2007                               DISCHARGE SUMMARY   FINAL DIAGNOSES:  1. Shortness of breath.  2. Chronic obstructive lung disease.  3. Hypothyroidism.  4. Anxiety.  5. Hypokalemia.   DISCHARGE MEDICATIONS:  1. Keppra 500 mg three tablets twice daily.  2. Calcium 500 mg two daily.  3. Hydrochlorothiazide 25 mg one daily.  4. KCL 10 mEq two daily.  5. Synthroid 0.112 mg one daily.  6. Nexium 40 mg one daily.  7. Norvasc 2.5 mg one daily.  8. Vitamin E 400 IU one daily.  9. Effexor 300 mg daily.  10.Albuterol inhaler two puffs up to four times daily.  11.Spiriva HandiHaler two puffs in the morning.  12.Advair 100/50 one puff twice daily.   DISCHARGE DIET:  Low sodium, heart-healthy diet.   DISCHARGE ACTIVITY:  Increase activity slowly.   SPECIAL INSTRUCTIONS:  Patient to stop any activity that causes chest  pain, shortness of breath, dizziness, sweating or excessive weakness.   FOLLOWUP:  By Dr. Orpah Cobb in two weeks; patient to call [phone]  for appointment.   HISTORY:  This 65 year old white female presented with recurrent  shortness of breath without significant chest discomfort.  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  She has no  history of diabetes, hypertension or cholesterol elevation, no history  of myocardial infarction, obesity or premature coronary artery disease  in the family.   PHYSICAL EXAMINATION:  VITAL SIGNS:  Pulse 95, respirations 18, blood  pressure 140/70.  HEENT:  Patient is normocephalic, atraumatic with brown eyes.  Pupils  reactive to light.  Extraocular movement intact.  NECK:  No JVD.  No carotid  bruit.  LUNGS:  Clear but expiration prolonged.  HEART:  Normal S1-S2 with grade 2/6 systolic murmur.  ABDOMEN:  Soft and nontender.  EXTREMITIES:  No edema, cyanosis or clubbing.  Peripheral pulses were  2+.  CNS:  Cranial nerves grossly intact.  Patient moved all four  extremities.   Laboratory data revealed normal hemoglobin, hematocrit, WBC count and  platelet count, normal INR of 0.9, near-normal electrolytes, BUN and  creatinine showed borderline at 108, CK-MB, troponin I negative x3.   EKG:  Normal sinus rhythm.   Nuclear stress test without evidence of myocardial ischemia, ejection  fraction of 73%.   HOSPITAL COURSE:  Patient was admitted to telemetry unit.  Myocardial  infarction was ruled out.  She was given bronchodilator therapy.  She  underwent pulmonary function testing and that revealed chronic  obstructive lung disease.  Her condition marginally improved.  Her  nuclear stress test failed to show any reversible ischemia.  She was  discharged home in satisfactory condition on January 18, 2007 with  outpatient referral to pulmonary physician.  Ricki Rodriguez, M.D.  Electronically Signed     ASK/MEDQ  D:  05/10/2007  T:  05/11/2007  Job:  [id number]

## 2011-04-16 NOTE — Cardiovascular Report (Signed)
[name], [name]               ACCOUNT NO.:  [id number]   MEDICAL RECORD NO.:  [id number]          PATIENT TYPE:  INP   LOCATION:  2019                         FACILITY:  [facility]   PHYSICIAN:  Ricki Rodriguez, M.D.  DATE OF BIRTH:  1946/06/23   DATE OF PROCEDURE:  04/28/2005  DATE OF DISCHARGE:                              CARDIAC CATHETERIZATION   DATE OF PROCEDURE:  Apr 28, 2005.  Procedure done by Dr. Ricki Rodriguez.   PROCEDURE:  Left heart catheterization, selective coronary angiography, left  ventricular function study.   INDICATIONS FOR PROCEDURE:  This 65 year old white female had typical chest  pain and abnormal electrocardiogram in less than 2 minutes of treadmill  stress test.  She has cardiac risk factors of hypertension.   COMPLICATIONS:  None.   DESCRIPTION OF PROCEDURE:  Approach right femoral artery using #5 French  sheath and catheters.  Less than 45 cc of the dye was used.   CORONARY ANATOMY:  1.  The left main coronary artery was short but unremarkable.  2.  The left anterior descending coronary artery had ostial 10 to 20%      narrowing, otherwise was unremarkable.  3.  The diagonal 1 vessel was also unremarkable.  4.  The left circumflex coronary artery had small ramus and obtuse marginal      1 branch and obtuse marginal branch 2 and 3 were larger vessels and      unremarkable.  5.  Right coronary artery:  The right coronary artery was dominant and      unremarkable.  Its posterior lateral branch and posterior descending      coronary artery were also unremarkable.   LEFT VENTRICULOGRAM:  The left ventriculogram showed normal left ventricular  systolic function with ejection fraction of 65%.   IMPRESSION:  1.  Near normal coronaries.  2.  Normal left ventricular systolic function.   RECOMMENDATIONS:  This patient will be treated medically.       ASK/MEDQ  D:  04/28/2005  T:  04/28/2005  Job:  [id number]

## 2011-04-16 NOTE — Op Note (Signed)
[place]  Patient:    [name], [name]                      MRN: [id number] Proc. Date: 02/22/00 Adm. Date:  [id number] Attending:  Charlton Haws                           Operative Report  PREOPERATIVE DIAGNOSIS:  Headaches, rule out temporal arteritis.  POSTOPERATIVE DIAGNOSIS:  Headaches, rule out temporal arteritis.  OPERATION PERFORMED:  Right temporal artery biopsy.  SURGEON:  Abigail Miyamoto, M.D.  ANESTHESIA:  1% lidocaine with epinephrine.  ESTIMATED BLOOD LOSS:  Minimal.  DESCRIPTION OF PROCEDURE:  The patient was brought to the operating room, identified at [facility].  She was placed supine on the operating room table and then her right temporal area was prepped and draped in the usual sterile fashion. The skin in front of the ear was then anesthetized with 1% lidocaine with epinephrine.  A small vertical incision was then made with a scalpel.  The incision was carried down with the Metzenbaum scissors through the fascial layer.  The temporal artery was then identified and dissected out with Metzenbaum scissors.  The artery was then dissected proximally and distally after being tied off with 4-0 silk ties.  The section of temporal artery was then completely removed and sent to pathology for identification.  Hemostasis appeared to be achieved.  The incision was then closed in layers, first with 3-0 Vicryl sutures and then a running 4-0  Monocryl.  Steri-Strips were then applied.  The patient tolerated the procedure  well.  All sponge, needle and instrument counts were verified correct at the end of the procedure.  The patient was then taken in stable condition to the recovery room. DD:  02/22/00 TD:  02/23/00 Job: [id number]

## 2011-04-16 NOTE — Discharge Summary (Signed)
[name], [name]               ACCOUNT NO.:  [id number]   MEDICAL RECORD NO.:  [id number]          PATIENT TYPE:  INP   LOCATION:  2019                         FACILITY:  [facility]   PHYSICIAN:  Ricki Rodriguez, M.D.  DATE OF BIRTH:  Mar 08, 1946   DATE OF ADMISSION:  04/27/2005  DATE OF DISCHARGE:  04/29/2005                                 DISCHARGE SUMMARY   DISCHARGE DIAGNOSES:  1.  Chest pain.  2.  Depression.  3.  Hypothyroidism.  4.  Esophageal reflux disease.   PROCEDURES:  Left heart catheterization, selective coronary angiography,  left ventricular function study done by Dr. Orpah Cobb.   DISCHARGE MEDICATIONS:  1.  Keppra 500 mg, two twice daily.  2.  Calcium carbonate 500 mg twice daily.  3.  Levothyroxine 112 mcg one daily.  4.  Nexium 40 mg one daily.  5.  Effexor 75 mg, three capsules daily.  6.  Robitussin DM, two teaspoons four times daily as needed.  7.  Vicodin as needed for headache.  8.  Hydrochlorothiazide 12.5 mg, one on Monday, Wednesday, and Friday.  9.  Potassium 10 mEq one daily.  10. Patient to discontinue Altace.   PAIN MANAGEMENT:  As tolerated and directed, Tylenol.   ACTIVITY:  As tolerated.   DIET:  Low fat, low salt diet.   WOUND CARE:  The patient is to notify right groin pain, swelling, or  discharge.   FOLLOW UP:  The patient is to follow up with Dr. Orpah Cobb in two weeks.  The patient is to call [phone] for appointment.   HISTORY OF PRESENT ILLNESS:  This 65 year old white female presented with  decreased exercise capacity for one to two months followed by chest pain in  less than two minutes of treadmill exercise that showed 1 mm ST depression  in the inferior leads.  Her past medical history was negative for diabetes,  hypertension.  She has a remote history of smoking.  No history of  cholesterol elevation, myocardial infarction, or obesity.   PHYSICAL EXAMINATION:  VITAL SIGNS:  Pulse 80, respirations 18, blood  pressure 130/80, height 5 feet 4 inches, weight 144 pounds.  HEENT:  The patient is normocephalic, atraumatic.  Eyes brown.  Pupils  reactive.  Extraocular movements intact.  NECK:  No JVD or bruit.  LUNGS:  Clear bilaterally.  HEART:  Normal S1 and S2 and a 2/6 systolic murmur.  ABDOMEN:  Soft and nontender.  EXTREMITIES:  No clubbing, cyanosis, or edema.  Peripheral pulses 2+.  CNS:  Grossly intact.   LABORATORY DATA:  Normal hemoglobin, hematocrit, WBC count, and platelet  count.  Normal INR.  Sodium slightly low at 128, potassium 3.3, chloride 90,  BUN 5, creatinine 0.8.  CK-MB, troponin I normal.   Chest x-ray revealed no acute process.  Cardiac catheterization showed near  normal coronaries, with 10 to 20% left anterior descending coronary artery  disease.  The left ventricular systolic function was also normal.   HOSPITAL COURSE:  The patient was admitted to telemetry unit.  Myocardial  infarction was ruled out.  She  underwent cardiac catheterization that failed  to show significant coronary artery disease.  Hence, her chest pain was most  likely non-cardiac in origin.  She had elevated cholesterol level; however,  the patient chose to continue diet therapy.  She tolerated ambulation post-  procedure and was discharged home in satisfactory condition, with followup  by me in two weeks.      Ricki Rodriguez, M.D.  Electronically Signed     ASK/MEDQ  D:  07/19/2005  T:  07/19/2005  Job:  [id number]

## 2011-08-13 ENCOUNTER — Other Ambulatory Visit: Payer: Self-pay | Admitting: Orthopaedic Surgery

## 2011-08-13 DIAGNOSIS — M542 Cervicalgia: Secondary | ICD-10-CM

## 2011-08-18 ENCOUNTER — Ambulatory Visit
Admission: RE | Admit: 2011-08-18 | Discharge: 2011-08-18 | Disposition: A | Discharge status: Home / Self Care | Payer: BC Managed Care – PPO | Source: Ambulatory Visit | Attending: Orthopaedic Surgery | Admitting: Orthopaedic Surgery

## 2011-08-18 DIAGNOSIS — M542 Cervicalgia: Secondary | ICD-10-CM

## 2011-08-30 ENCOUNTER — Ambulatory Visit: Payer: BC Managed Care – PPO | Attending: Orthopaedic Surgery | Admitting: Physical Therapy

## 2011-08-30 DIAGNOSIS — M2569 Stiffness of other specified joint, not elsewhere classified: Secondary | ICD-10-CM | POA: Insufficient documentation

## 2011-08-30 DIAGNOSIS — M542 Cervicalgia: Secondary | ICD-10-CM | POA: Insufficient documentation

## 2011-08-30 DIAGNOSIS — IMO0001 Reserved for inherently not codable concepts without codable children: Secondary | ICD-10-CM | POA: Insufficient documentation

## 2011-09-02 ENCOUNTER — Ambulatory Visit: Payer: BC Managed Care – PPO | Admitting: Physical Therapy

## 2011-09-09 ENCOUNTER — Ambulatory Visit: Payer: BC Managed Care – PPO | Admitting: Physical Therapy

## 2012-02-02 ENCOUNTER — Other Ambulatory Visit: Payer: Self-pay | Admitting: Obstetrics & Gynecology

## 2012-02-02 DIAGNOSIS — R928 Other abnormal and inconclusive findings on diagnostic imaging of breast: Secondary | ICD-10-CM

## 2012-02-09 ENCOUNTER — Ambulatory Visit
Admission: RE | Admit: 2012-02-09 | Discharge: 2012-02-09 | Disposition: A | Discharge status: Home / Self Care | Payer: BC Managed Care – PPO | Source: Ambulatory Visit | Attending: Obstetrics & Gynecology | Admitting: Obstetrics & Gynecology

## 2012-02-09 DIAGNOSIS — R928 Other abnormal and inconclusive findings on diagnostic imaging of breast: Secondary | ICD-10-CM

## 2012-10-12 ENCOUNTER — Other Ambulatory Visit: Payer: Self-pay | Admitting: Orthopaedic Surgery

## 2012-10-12 DIAGNOSIS — M25511 Pain in right shoulder: Secondary | ICD-10-CM

## 2012-10-15 ENCOUNTER — Ambulatory Visit
Admission: RE | Admit: 2012-10-15 | Discharge: 2012-10-15 | Disposition: A | Discharge status: Home / Self Care | Payer: BC Managed Care – PPO | Source: Ambulatory Visit | Attending: Orthopaedic Surgery | Admitting: Orthopaedic Surgery

## 2012-10-15 DIAGNOSIS — M25511 Pain in right shoulder: Secondary | ICD-10-CM

## 2012-11-30 ENCOUNTER — Other Ambulatory Visit (HOSPITAL_COMMUNITY): Payer: Self-pay

## 2012-12-04 NOTE — H&P (Signed)
[name] Campbell, MD   Jacqualine Code, PA-C [address] [place], [place]  [address]                             [phone]   ORTHOPAEDIC HISTORY & PHYSICAL  [name] MRN:  [id number] DOB/SEX:  09-18-1946/female  CHIEF COMPLAINT:  Painful right shoulder  HISTORY: [name] is a very pleasant 67 year old white female who is seen today for followup evaluation of her right shoulder.  She states over a year ago she started having some cervical spine, right shoulder, and right arm pain.  At that time she felt it was cervical radiculopathy and that possibly of impingement.  Treated conservatively and had come back to haunt her.  In July she had injection by Dr. Corliss Skains but did not have a tremendous benefit to the right shoulder.  She also had a fall in October when she was with a child and landed and struck the lateral aspect of her right elbow and also had injured the right shoulder.  She is a very thin woman and this was quite traumatic for her.  She has been having nighttime pain and pain throughout the day with regards to her right shoulder.  She has difficulties with activities of daily living.  She denies any neurovascular compromise. MRI scan reveals a large, full thickness, retracted tear of the supraspinatus measuring 3.5 cm in diameter.  The remainder of the rotator cuff is intact.  She does have slight atrophy of the supraspinatus muscle.  Normal AC joint.  Type II acromion.    PAST MEDICAL HISTORY: There are no active problems to display for this patient.  Past Medical History  Diagnosis Date  . Asthma   . Hypertension   . Anxiety   . Depression   . Arthritis   . GERD (gastroesophageal reflux disease)   . Headache   . Hypothyroidism   . PONV (postoperative nausea and vomiting)   . Wears glasses    Past Surgical History  Procedure Date  . Cystocele repair 8/10    ant/post-rectocele/cystocele repair  . Cholecystectomy 1/10    lap choli  . Tonsillectomy    age 54  . Appendectomy     age 14  . Abdominal hysterectomy 1981  . Tubal ligation 1976  . Ovary surgery 1995  . Colonoscopy      MEDICATIONS:   Prescriptions prior to admission  Medication Sig Dispense Refill  . amLODipine (NORVASC) 5 MG tablet Take 5 mg by mouth daily.      . budesonide-formoterol (SYMBICORT) 160-4.5 MCG/ACT inhaler Inhale 2 puffs into the lungs 2 (two) times daily.      . calcium-vitamin D (OSCAL WITH D) 500-200 MG-UNIT per tablet Take 1 tablet by mouth daily.      Marland Kitchen estradiol (ESTRACE) 2 MG tablet Take 2 mg by mouth daily.      Marland Kitchen HYDROmorphone (DILAUDID) 2 MG tablet Take 2 mg by mouth every 6 (six) hours as needed.      . levETIRAcetam (KEPPRA) 500 MG tablet Take 500 mg by mouth at bedtime. Takes 3-500mg =1500mg       . levETIRAcetam (KEPPRA) 500 MG tablet Take 500 mg by mouth every morning.      Marland Kitchen levothyroxine (SYNTHROID, LEVOTHROID) 100 MCG tablet Take 100 mcg by mouth daily.      Marland Kitchen omeprazole (PRILOSEC) 20 MG capsule Take 20 mg by mouth 2 (two) times daily at  10 AM and 5 PM.      . spironolactone (ALDACTONE) 25 MG tablet Take 25 mg by mouth daily.      . traMADol (ULTRAM) 50 MG tablet Take 50 mg by mouth every 6 (six) hours as needed.      . venlafaxine XR (EFFEXOR-XR) 150 MG 24 hr capsule Take 150 mg by mouth daily.      Marland Kitchen zolpidem (AMBIEN) 5 MG tablet Take 5 mg by mouth at bedtime as needed.        ALLERGIES:   Allergies  Allergen Reactions  . Doxycycline Nausea And Vomiting  . Cleocin (Clindamycin Hcl) Rash    REVIEW OF SYSTEMS:  A comprehensive review of systems was negative except for: Constitutional: positive for depression Respiratory: positive for asthma Cardiovascular: positive for hypertension Neurological: positive for migraines Endocrine: positive for hypothyroidism   FAMILY HISTORY:  History reviewed. No pertinent family history.  SOCIAL HISTORY:   History  Substance Use Topics  . Smoking status: Former Smoker    Quit date:  12/07/1988  . Smokeless tobacco: Not on file  . Alcohol Use: Yes     Comment: occ      EXAMINATION: Vital signs in last 24 hours: Temp:  [98.3 F (36.8 C)] 98.3 F (36.8 C) (01/14 1251) Pulse Rate:  [75-85] 81  (01/14 1310) Resp:  [12-20] 12  (01/14 1310) BP: (110)/(58) 110/58 mmHg (01/14 1251) SpO2:  [95 %-100 %] 100 % (01/14 1310) Weight:  [61.349 kg (135 lb 4 oz)] 61.349 kg (135 lb 4 oz) (01/14 1251)  Head is normocephalic.   Eyes:  Pupils equal, round and reactive to light and accommodation.  Extraocular intact. ENT: Ears, nose, and throat were benign.   Neck: supple, no bruits were noted.   Chest: good expansion.   Lungs: essentially clear.   Cardiac: regular rhythm and rate, normal S1, S2.  No murmurs appreciated. Pulses :  1+ bilateral and symmetric in upper extremities. Abdomen is scaphoid, soft, nontender, no masses palpable, normal bowel sounds present. CNS:  He is oriented x3 and cranial nerves II-XII grossly intact. Breast, rectal, and genital exams: not performed and not indicated for an orthopedic evaluation. Musculoskeletal: she is actively able to do 150 degrees of forward flexion and 95 degrees of abduction.  She does have a little tenderness over the [facility] joint.  Passively, I can get her to about 150 degrees of abduction and forward flexion.  She is weak in abduction as well as external rotation.  She has good strength in internal rotation.  Good grip strength.  Good biceps and triceps strength.       Imaging Review MRI scan reveals a large, full thickness, retracted tear of the supraspinatus measuring 3.5 cm in diameter.  The remainder of the rotator cuff is intact.  She does have slight atrophy of the supraspinatus muscle.  Normal AC joint.  Type II acromion.   ASSESSMENT: Full thickness retracted right supraspinatus tear of the rotator cuff.     Past Medical History  Diagnosis Date  . Asthma   . Hypertension   . Anxiety   . Depression   . Arthritis     . GERD (gastroesophageal reflux disease)   . Headache   . Hypothyroidism   . PONV (postoperative nausea and vomiting)   . Wears glasses     PLAN: Plan for right Arthroscopic shoulder SAD, mini open rotator cuff repair possible graft jacket  The procedure,  risks, and benefits of total knee  arthroplasty were presented and reviewed. The risks including but not limited to infection, blood clots, vascular and nerve injury, stiffness,  among others were discussed. The patient acknowledged the explanation, agreed to proceed.   Kyerra Vargo 12/12/2012, 1:17 PM

## 2012-12-07 ENCOUNTER — Encounter (HOSPITAL_BASED_OUTPATIENT_CLINIC_OR_DEPARTMENT_OTHER): Payer: Self-pay | Admitting: *Deleted

## 2012-12-07 NOTE — Progress Notes (Signed)
Pt has hx asthma-doing well-will use symbicort dos anyway-had episode cp 2008-saw dr Algie Coffer -all tests negative- Will come in for all labs,cxr,ekg per dr whitfields PA-to bring all meds and overnight bag in case she needs to stay post op

## 2012-12-11 ENCOUNTER — Ambulatory Visit
Admission: RE | Admit: 2012-12-11 | Discharge: 2012-12-11 | Disposition: A | Discharge status: Home / Self Care | Payer: BC Managed Care – PPO | Source: Ambulatory Visit | Attending: Orthopedic Surgery | Admitting: Orthopedic Surgery

## 2012-12-11 ENCOUNTER — Encounter (HOSPITAL_BASED_OUTPATIENT_CLINIC_OR_DEPARTMENT_OTHER)
Admission: RE | Admit: 2012-12-11 | Discharge: 2012-12-11 | Disposition: A | Discharge status: Home / Self Care | Payer: BC Managed Care – PPO | Source: Ambulatory Visit | Attending: Orthopaedic Surgery | Admitting: Orthopaedic Surgery

## 2012-12-11 LAB — URINALYSIS, ROUTINE W REFLEX MICROSCOPIC
Bilirubin Urine: NEGATIVE
Glucose, UA: NEGATIVE mg/dL
Hgb urine dipstick: NEGATIVE
Protein, ur: NEGATIVE mg/dL

## 2012-12-11 LAB — COMPREHENSIVE METABOLIC PANEL
Albumin: 3.7 g/dL (ref 3.5–5.2)
Alkaline Phosphatase: 80 U/L (ref 39–117)
BUN: 8 mg/dL (ref 6–23)
CO2: 30 mEq/L (ref 19–32)
Chloride: 97 mEq/L (ref 96–112)
Creatinine, Ser: 0.64 mg/dL (ref 0.50–1.10)
GFR calc non Af Amer: >90 mL/min (ref >90)
Glucose, Bld: 73 mg/dL (ref 70–99)
Potassium: 3.9 mEq/L (ref 3.5–5.1)
Total Bilirubin: 0.2 mg/dL — ABNORMAL LOW (ref 0.3–1.2)

## 2012-12-11 LAB — CBC
HCT: 38.2 % (ref 36.0–46.0)
Hemoglobin: 13 g/dL (ref 12.0–15.0)
MCV: 86 fL (ref 78.0–100.0)
RBC: 4.44 MIL/uL (ref 3.87–5.11)
WBC: 6.6 10*3/uL (ref 4.0–10.5)

## 2012-12-11 LAB — PROTIME-INR
INR: 0.95 (ref 0.00–1.49)
Prothrombin Time: 12.6 seconds (ref 11.6–15.2)

## 2012-12-12 ENCOUNTER — Encounter (HOSPITAL_BASED_OUTPATIENT_CLINIC_OR_DEPARTMENT_OTHER): Payer: Self-pay | Admitting: Anesthesiology

## 2012-12-12 ENCOUNTER — Ambulatory Visit (HOSPITAL_BASED_OUTPATIENT_CLINIC_OR_DEPARTMENT_OTHER): Payer: BC Managed Care – PPO | Admitting: Anesthesiology

## 2012-12-12 ENCOUNTER — Encounter (HOSPITAL_BASED_OUTPATIENT_CLINIC_OR_DEPARTMENT_OTHER)
Admission: RE | Disposition: A | Discharge status: Home / Self Care | Payer: Self-pay | Source: Ambulatory Visit | Attending: Orthopaedic Surgery

## 2012-12-12 ENCOUNTER — Encounter (HOSPITAL_BASED_OUTPATIENT_CLINIC_OR_DEPARTMENT_OTHER): Payer: Self-pay | Admitting: *Deleted

## 2012-12-12 ENCOUNTER — Ambulatory Visit (HOSPITAL_BASED_OUTPATIENT_CLINIC_OR_DEPARTMENT_OTHER)
Admission: RE | Admit: 2012-12-12 | Discharge: 2012-12-12 | Disposition: A | Discharge status: Home / Self Care | Payer: BC Managed Care – PPO | Source: Ambulatory Visit | Attending: Orthopaedic Surgery | Admitting: Orthopaedic Surgery

## 2012-12-12 DIAGNOSIS — M659 Unspecified synovitis and tenosynovitis, unspecified site: Secondary | ICD-10-CM | POA: Insufficient documentation

## 2012-12-12 DIAGNOSIS — I10 Essential (primary) hypertension: Secondary | ICD-10-CM | POA: Insufficient documentation

## 2012-12-12 DIAGNOSIS — M24119 Other articular cartilage disorders, unspecified shoulder: Secondary | ICD-10-CM | POA: Insufficient documentation

## 2012-12-12 DIAGNOSIS — M25819 Other specified joint disorders, unspecified shoulder: Secondary | ICD-10-CM | POA: Insufficient documentation

## 2012-12-12 DIAGNOSIS — M67919 Unspecified disorder of synovium and tendon, unspecified shoulder: Secondary | ICD-10-CM | POA: Insufficient documentation

## 2012-12-12 DIAGNOSIS — E039 Hypothyroidism, unspecified: Secondary | ICD-10-CM | POA: Insufficient documentation

## 2012-12-12 DIAGNOSIS — K219 Gastro-esophageal reflux disease without esophagitis: Secondary | ICD-10-CM | POA: Insufficient documentation

## 2012-12-12 DIAGNOSIS — M75101 Unspecified rotator cuff tear or rupture of right shoulder, not specified as traumatic: Secondary | ICD-10-CM

## 2012-12-12 DIAGNOSIS — M719 Bursopathy, unspecified: Secondary | ICD-10-CM | POA: Insufficient documentation

## 2012-12-12 DIAGNOSIS — Z79899 Other long term (current) drug therapy: Secondary | ICD-10-CM | POA: Insufficient documentation

## 2012-12-12 DIAGNOSIS — Z01812 Encounter for preprocedural laboratory examination: Secondary | ICD-10-CM | POA: Insufficient documentation

## 2012-12-12 DIAGNOSIS — J45909 Unspecified asthma, uncomplicated: Secondary | ICD-10-CM | POA: Insufficient documentation

## 2012-12-12 HISTORY — DX: Other specified postprocedural states: Z98.890

## 2012-12-12 HISTORY — DX: Unspecified osteoarthritis, unspecified site: M19.90

## 2012-12-12 HISTORY — DX: Presence of spectacles and contact lenses: Z97.3

## 2012-12-12 HISTORY — DX: Depression, unspecified: F32.A

## 2012-12-12 HISTORY — DX: Major depressive disorder, single episode, unspecified: F32.9

## 2012-12-12 HISTORY — DX: Anxiety disorder, unspecified: F41.9

## 2012-12-12 HISTORY — DX: Nausea with vomiting, unspecified: R11.2

## 2012-12-12 HISTORY — DX: Unspecified asthma, uncomplicated: J45.909

## 2012-12-12 HISTORY — DX: Essential (primary) hypertension: I10

## 2012-12-12 HISTORY — DX: Hypothyroidism, unspecified: E03.9

## 2012-12-12 HISTORY — DX: Headache: R51

## 2012-12-12 HISTORY — PX: SHOULDER ARTHROSCOPY WITH ROTATOR CUFF REPAIR AND SUBACROMIAL DECOMPRESSION: SHX5686

## 2012-12-12 HISTORY — DX: Gastro-esophageal reflux disease without esophagitis: K21.9

## 2012-12-12 SURGERY — SHOULDER ARTHROSCOPY WITH ROTATOR CUFF REPAIR AND SUBACROMIAL DECOMPRESSION
Anesthesia: Regional | Site: Shoulder | Laterality: Right | Wound class: Clean

## 2012-12-12 MED ORDER — CHLORHEXIDINE GLUCONATE 4 % EX LIQD: 60.0000 mL | Freq: Once | CUTANEOUS | Status: DC

## 2012-12-12 MED ORDER — SODIUM CHLORIDE 0.9 % IR SOLN
Status: DC | PRN
  Administered 2012-12-12: 9

## 2012-12-12 MED ORDER — LIDOCAINE HCL 4 % MT SOLN
OROMUCOSAL | Status: DC | PRN
  Administered 2012-12-12: 4 mL via TOPICAL

## 2012-12-12 MED ORDER — OXYCODONE HCL 5 MG/5ML PO SOLN: 5.0000 mg | Freq: Once | ORAL | Status: DC | PRN

## 2012-12-12 MED ORDER — HYDROMORPHONE HCL PF 1 MG/ML IJ SOLN
0.2500 mg | INTRAMUSCULAR | Status: DC | PRN
  Administered 2012-12-12 (×4): 0.5 mg via INTRAVENOUS

## 2012-12-12 MED ORDER — DEXAMETHASONE SODIUM PHOSPHATE 4 MG/ML IJ SOLN
INTRAMUSCULAR | Status: DC | PRN
  Administered 2012-12-12: 10 mg via INTRAVENOUS

## 2012-12-12 MED ORDER — ONDANSETRON HCL 4 MG/2ML IJ SOLN: 4.0000 mg | Freq: Once | INTRAMUSCULAR | Status: DC | PRN

## 2012-12-12 MED ORDER — ACETAMINOPHEN 10 MG/ML IV SOLN
1000.0000 mg | Freq: Once | INTRAVENOUS | Status: AC
  Administered 2012-12-12: 1000 mg via INTRAVENOUS

## 2012-12-12 MED ORDER — CEFAZOLIN SODIUM-DEXTROSE 2-3 GM-% IV SOLR
2.0000 g | INTRAVENOUS | Status: AC
  Administered 2012-12-12: 2 g via INTRAVENOUS

## 2012-12-12 MED ORDER — ONDANSETRON HCL 4 MG/2ML IJ SOLN
INTRAMUSCULAR | Status: DC | PRN
  Administered 2012-12-12: 4 mg via INTRAVENOUS

## 2012-12-12 MED ORDER — FENTANYL CITRATE 0.05 MG/ML IJ SOLN
50.0000 ug | INTRAMUSCULAR | Status: DC | PRN
  Administered 2012-12-12: 100 ug via INTRAVENOUS

## 2012-12-12 MED ORDER — PROPOFOL 10 MG/ML IV BOLUS
INTRAVENOUS | Status: DC | PRN
  Administered 2012-12-12: 170 mg via INTRAVENOUS

## 2012-12-12 MED ORDER — HYDROMORPHONE HCL 2 MG PO TABS: 2.0000 mg | ORAL_TABLET | ORAL | Status: DC | PRN

## 2012-12-12 MED ORDER — EPINEPHRINE HCL 1 MG/ML IJ SOLN
INTRAMUSCULAR | Status: DC | PRN
  Administered 2012-12-12: .1 mg via SUBCUTANEOUS

## 2012-12-12 MED ORDER — MIDAZOLAM HCL 2 MG/2ML IJ SOLN
1.0000 mg | INTRAMUSCULAR | Status: DC | PRN
  Administered 2012-12-12: 2 mg via INTRAVENOUS

## 2012-12-12 MED ORDER — BUPIVACAINE HCL (PF) 0.5 % IJ SOLN
INTRAMUSCULAR | Status: DC | PRN
  Administered 2012-12-12: 25 mL

## 2012-12-12 MED ORDER — SUCCINYLCHOLINE CHLORIDE 20 MG/ML IJ SOLN
INTRAMUSCULAR | Status: DC | PRN
  Administered 2012-12-12: 100 mg via INTRAVENOUS

## 2012-12-12 MED ORDER — LACTATED RINGERS IV SOLN
INTRAVENOUS | Status: DC
  Administered 2012-12-12 (×2): via INTRAVENOUS

## 2012-12-12 MED ORDER — LIDOCAINE HCL (CARDIAC) 20 MG/ML IV SOLN
INTRAVENOUS | Status: DC | PRN
  Administered 2012-12-12: 80 mg via INTRAVENOUS

## 2012-12-12 MED ORDER — ACETAMINOPHEN 10 MG/ML IV SOLN
INTRAVENOUS | Status: DC | PRN
  Administered 2012-12-12: 1000 mg via INTRAVENOUS

## 2012-12-12 MED ORDER — SODIUM CHLORIDE 0.9 % IV SOLN: INTRAVENOUS | Status: DC

## 2012-12-12 MED ORDER — OXYCODONE HCL 5 MG PO TABS: 5.0000 mg | ORAL_TABLET | Freq: Once | ORAL | Status: DC | PRN

## 2012-12-12 MED ORDER — CHLORHEXIDINE GLUCONATE 4 % EX LIQD: 60.0000 mL | Freq: Every day | CUTANEOUS | Status: DC

## 2012-12-12 SURGICAL SUPPLY — 68 items
ANCHOR 5.5 W/NEEDLE (Anchor) ×1 IMPLANT
APL SKNCLS STERI-STRIP NONHPOA (GAUZE/BANDAGES/DRESSINGS)
BAG DECANTER FOR FLEXI CONT (MISCELLANEOUS) IMPLANT
BENZOIN TINCTURE PRP APPL 2/3 (GAUZE/BANDAGES/DRESSINGS) IMPLANT
BLADE 4.2CUDA (BLADE) IMPLANT
BLADE AVERAGE 25X9 (BLADE) IMPLANT
BLADE CUDA 5.5 (BLADE) IMPLANT
BLADE GREAT WHITE 4.2 (BLADE) IMPLANT
BLADE SURG 15 STRL LF DISP TIS (BLADE) IMPLANT
BLADE SURG 15 STRL SS (BLADE) ×2
BUR OVAL 6.0 (BURR) ×2 IMPLANT
CANISTER OMNI JUG 16 LITER (MISCELLANEOUS) IMPLANT
CANISTER SUCTION 2500CC (MISCELLANEOUS) IMPLANT
CANNULA ACUFLEX KIT 5X76 (CANNULA) ×2 IMPLANT
CLEANER CAUTERY TIP 5X5 PAD (MISCELLANEOUS) IMPLANT
CUTTER MENISCUS  4.2MM (BLADE)
CUTTER MENISCUS 4.2MM (BLADE) IMPLANT
DECANTER SPIKE VIAL GLASS SM (MISCELLANEOUS) IMPLANT
DRAPE SHOULDER BEACH CHAIR (DRAPES) ×2 IMPLANT
DRAPE SURG 17X23 STRL (DRAPES) ×3 IMPLANT
DRAPE U-SHAPE 47X51 STRL (DRAPES) ×2 IMPLANT
DRSG EMULSION OIL 3X3 NADH (GAUZE/BANDAGES/DRESSINGS) ×4 IMPLANT
DRSG PAD ABDOMINAL 8X10 ST (GAUZE/BANDAGES/DRESSINGS) ×4 IMPLANT
DURAPREP 26ML APPLICATOR (WOUND CARE) ×2 IMPLANT
ELECT NDL TIP 2.8 STRL (NEEDLE) IMPLANT
ELECT NEEDLE TIP 2.8 STRL (NEEDLE) IMPLANT
ELECT REM PT RETURN 9FT ADLT (ELECTROSURGICAL) ×2
ELECTRODE REM PT RTRN 9FT ADLT (ELECTROSURGICAL) ×1 IMPLANT
GAUZE SPONGE 4X4 16PLY XRAY LF (GAUZE/BANDAGES/DRESSINGS) IMPLANT
GLOVE BIOGEL PI IND STRL 8 (GLOVE) ×1 IMPLANT
GLOVE BIOGEL PI INDICATOR 8 (GLOVE) ×1
GLOVE ECLIPSE 8.0 STRL XLNG CF (GLOVE) ×4 IMPLANT
GLOVE SKINSENSE NS SZ7.0 (GLOVE) ×1
GLOVE SKINSENSE STRL SZ7.0 (GLOVE) IMPLANT
GOWN PREVENTION PLUS XLARGE (GOWN DISPOSABLE) ×3 IMPLANT
GOWN PREVENTION PLUS XXLARGE (GOWN DISPOSABLE) ×1 IMPLANT
NDL 1/2 CIR CATGUT .05X1.09 (NEEDLE) IMPLANT
NEEDLE 1/2 CIR CATGUT .05X1.09 (NEEDLE) IMPLANT
NS IRRIG 1000ML POUR BTL (IV SOLUTION) IMPLANT
PACK ARTHROSCOPY DSU (CUSTOM PROCEDURE TRAY) ×2 IMPLANT
PACK BASIN DAY SURGERY FS (CUSTOM PROCEDURE TRAY) ×2 IMPLANT
PAD CLEANER CAUTERY TIP 5X5 (MISCELLANEOUS) ×1
PENCIL BUTTON HOLSTER BLD 10FT (ELECTRODE) ×1 IMPLANT
SET ARTHROSCOPY TUBING (MISCELLANEOUS) ×2
SET ARTHROSCOPY TUBING LN (MISCELLANEOUS) ×1 IMPLANT
SLING ARM FOAM STRAP LRG (SOFTGOODS) IMPLANT
SLING ARM FOAM STRAP MED (SOFTGOODS) ×1 IMPLANT
SPONGE GAUZE 4X4 12PLY (GAUZE/BANDAGES/DRESSINGS) ×2 IMPLANT
SPONGE LAP 4X18 X RAY DECT (DISPOSABLE) ×3 IMPLANT
STAPLER VISISTAT 35W (STAPLE) IMPLANT
STRIP CLOSURE SKIN 1/2X4 (GAUZE/BANDAGES/DRESSINGS) IMPLANT
SUCTION FRAZIER TIP 10 FR DISP (SUCTIONS) IMPLANT
SUT BONE WAX W31G (SUTURE) IMPLANT
SUT ETHIBOND 0 MO6 C/R (SUTURE) ×1 IMPLANT
SUT ETHILON 3 0 PS 1 (SUTURE) IMPLANT
SUT ETHILON 4 0 PS 2 18 (SUTURE) IMPLANT
SUT MNCRL AB 4-0 PS2 18 (SUTURE) ×1 IMPLANT
SUT PROLENE 3 0 PS 2 (SUTURE) IMPLANT
SUT VIC AB 0 CT1 27 (SUTURE) ×2
SUT VIC AB 0 CT1 27XBRD ANBCTR (SUTURE) IMPLANT
SUT VIC AB 2-0 PS2 27 (SUTURE) IMPLANT
SUT VIC AB 2-0 SH 27 (SUTURE)
SUT VIC AB 2-0 SH 27XBRD (SUTURE) IMPLANT
SYR BULB 3OZ (MISCELLANEOUS) ×1 IMPLANT
TOWEL OR 17X24 6PK STRL BLUE (TOWEL DISPOSABLE) ×2 IMPLANT
WAND STAR VAC 90 (SURGICAL WAND) ×2 IMPLANT
WATER STERILE IRR 1000ML POUR (IV SOLUTION) ×2 IMPLANT
YANKAUER SUCT BULB TIP NO VENT (SUCTIONS) ×1 IMPLANT

## 2012-12-12 NOTE — Progress Notes (Signed)
Assisted Dr. Crews with right, ultrasound guided, interscalene  block. Side rails up, monitors on throughout procedure. See vital signs in flow sheet. Tolerated Procedure well. 

## 2012-12-12 NOTE — Anesthesia Postprocedure Evaluation (Signed)
  Anesthesia Post-op Note  Patient: [name]  Procedure(s) Performed: Procedure(s) (LRB) with comments: SHOULDER ARTHROSCOPY WITH ROTATOR CUFF REPAIR AND SUBACROMIAL DECOMPRESSION (Right) - Right Shoulder Mini Open Rotator Cuff Repair and Subacromial Decompression, possible Patch.  Patient Location: PACU  Anesthesia Type:GA combined with regional for post-op pain  Level of Consciousness: awake, alert  and oriented  Airway and Oxygen Therapy: Patient Spontanous Breathing and Patient connected to face mask oxygen  Post-op Pain: none  Post-op Assessment: Post-op Vital signs reviewed  Post-op Vital Signs: Reviewed  Complications: No apparent anesthesia complications

## 2012-12-12 NOTE — Op Note (Signed)
PATIENT ID:      [name]  MRN:     [id number] DOB/AGE:    1946/01/03 / 68 y.o.       OPERATIVE REPORT    DATE OF PROCEDURE:  12/12/2012       PREOPERATIVE DIAGNOSIS:   Rt Shoulder Full Thickness Rotator Cuff Tear with Impingement                                                       Estimated Body mass index is 23.96 kg/(m^2) as calculated from the following:   Height as of this encounter: 5\' 3" (1.6 m).   Weight as of this encounter: 135 lb 4 oz(61.349 kg).     POSTOPERATIVE DIAGNOSIS:   Rt Shoulder Full Thickness Rotator Cuff Tear with Impingement                                                                     Estimated Body mass index is 23.96 kg/(m^2) as calculated from the following:   Height as of this encounter: 5\' 3" (1.6 m).   Weight as of this encounter: 135 lb 4 oz(61.349 kg).     PROCEDURE:  Procedure(s): SHOULDER ARTHROSCOPY WITH ROTATOR CUFF REPAIR AND SUBACROMIAL DECOMPRESSION right,arthroscopic synovectomy with debridement of frayed labrum     SURGEON:  Norlene Campbell, MD    ASSISTANT:   Jacqualine Code, PA-C   (Present and scrubbed throughout the case, critical for assistance with exposure, retraction, instrumentation, and closure.)          ANESTHESIA: regional and general     DRAINS: none :      TOURNIQUET TIME: * No tourniquets in log *    COMPLICATIONS:  None   CONDITION:  stable  PROCEDURE IN DETAIL: [id number]    [name] 12/12/2012, 2:39 PM

## 2012-12-12 NOTE — Transfer of Care (Signed)
Immediate Anesthesia Transfer of Care Note  Patient: [name]  Procedure(s) Performed: Procedure(s) (LRB) with comments: SHOULDER ARTHROSCOPY WITH ROTATOR CUFF REPAIR AND SUBACROMIAL DECOMPRESSION (Right) - Right Shoulder Mini Open Rotator Cuff Repair and Subacromial Decompression, possible Patch.  Patient Location: PACU  Anesthesia Type:GA combined with regional for post-op pain  Level of Consciousness: awake, alert  and oriented  Airway & Oxygen Therapy: Patient Spontanous Breathing and Patient connected to face mask oxygen  Post-op Assessment: Report given to PACU RN and Post -op Vital signs reviewed and stable  Post vital signs: Reviewed and stable  Complications: No apparent anesthesia complications

## 2012-12-12 NOTE — Anesthesia Preprocedure Evaluation (Addendum)
Anesthesia Evaluation  Patient identified by MRN, date of birth, ID band Patient awake    Reviewed: Allergy & Precautions, H&P , NPO status , Patient's Chart, lab work & pertinent test results  History of Anesthesia Complications (+) PONV  Airway       Dental  (+) Teeth Intact and Dental Advisory Given   Pulmonary  breath sounds clear to auscultation        Cardiovascular hypertension, Pt. on medications Rhythm:Regular Rate:Normal     Neuro/Psych    GI/Hepatic GERD-  Medicated and Controlled,  Endo/Other    Renal/GU      Musculoskeletal   Abdominal   Peds  Hematology   Anesthesia Other Findings   Reproductive/Obstetrics                          Anesthesia Physical Anesthesia Plan  ASA: II  Anesthesia Plan: General   Post-op Pain Management:    Induction: Intravenous  Airway Management Planned: Oral ETT  Additional Equipment:   Intra-op Plan:   Post-operative Plan: Extubation in OR  Informed Consent: I have reviewed the patients History and Physical, chart, labs and discussed the procedure including the risks, benefits and alternatives for the proposed anesthesia with the patient or authorized representative who has indicated his/her understanding and acceptance.   Dental advisory given  Plan Discussed with: CRNA, Anesthesiologist and Surgeon  Anesthesia Plan Comments:         Anesthesia Quick Evaluation

## 2012-12-12 NOTE — Anesthesia Procedure Notes (Addendum)
Anesthesia Regional Block:  Interscalene brachial plexus block  Pre-Anesthetic Checklist: ,, timeout performed, Correct Patient, Correct Site, Correct Laterality, Correct Procedure, Correct Position, site marked, Risks and benefits discussed,  Surgical consent,  Pre-op evaluation,  At surgeon's request and post-op pain management  Laterality: Upper and Right  Prep: chloraprep       Needles:  Injection technique: Single-shot  Needle Type: Echogenic Needle     Needle Length: 5cm 5 cm Needle Gauge: 21    Additional Needles:  Procedures: ultrasound guided (picture in chart) Interscalene brachial plexus block Narrative:  Start time: 12/12/2012 1:10 PM End time: 12/12/2012 1:18 PM Injection made incrementally with aspirations every 5 mL.  Performed by: Personally  Anesthesiologist: Sheldon Silvan  Supraclavicular block Procedure Name: Intubation Date/Time: 12/12/2012 1:42 PM Performed by: Burna Cash Pre-anesthesia Checklist: Patient identified, Emergency Drugs available, Suction available and Patient being monitored Patient Re-evaluated:Patient Re-evaluated prior to inductionOxygen Delivery Method: Circle System Utilized Preoxygenation: Pre-oxygenation with 100% oxygen Intubation Type: IV induction Ventilation: Mask ventilation without difficulty Laryngoscope Size: Mac and 3 Grade View: Grade I Tube type: Oral Tube size: 7.0 mm Number of attempts: 1 Airway Equipment and Method: stylet and oral airway Placement Confirmation: ETT inserted through vocal cords under direct vision,  positive ETCO2 and breath sounds checked- equal and bilateral Secured at: 21 cm Tube secured with: Tape Dental Injury: Teeth and Oropharynx as per pre-operative assessment

## 2012-12-12 NOTE — Progress Notes (Signed)
Patient ID: [name], female   DOB: 09-Jan-1946, 67 y.o.   MRN: [id number] The recent History & Physical has been reviewed. I have personally examined the patient today. There is no interval change to the documented History & Physical. The patient would like to proceed with the procedure.  Norlene Campbell W 12/12/2012,  1:31 PM

## 2012-12-13 ENCOUNTER — Encounter (HOSPITAL_BASED_OUTPATIENT_CLINIC_OR_DEPARTMENT_OTHER): Payer: Self-pay | Admitting: Orthopaedic Surgery

## 2012-12-13 NOTE — Op Note (Signed)
NAMEMILILANI, MURTHY NO.:  [id number]  MEDICAL RECORD NO.:  [id number]  LOCATION:                                 FACILITY:  PHYSICIAN:  Claude Manges. Teliyah Royal, M.D.DATE OF BIRTH:  02-21-1946  DATE OF PROCEDURE:  12/12/2012 DATE OF DISCHARGE:                              OPERATIVE REPORT   PREOPERATIVE DIAGNOSIS:  Rotator cuff tear with impingement, right shoulder.  POSTOPERATIVE DIAGNOSES:  Rotator cuff tear with impingement, right shoulder with glenohumeral joint synovitis and frayed glenoid labrum anteriorly.  PROCEDURES: 1. Diagnostic arthroscopy, right shoulder with debridement of     synovitis and frayed glenoid labrum, glenohumeral joint. 2. Arthroscopic subacromial decompression. 3. Mini-open rotator cuff tear repair.  SURGEON:  Claude Manges. Cleophas Dunker, M.D.  ASSISTANT:  Arlys John D. Petrarca, PA-C  ANESTHESIA:  General with supplemental interscalene nerve block.  COMPLICATIONS:  None.  HISTORY:  A 67 year old female who has had some trouble with her neck and shoulder for over year, but had an exacerbation when she fell in October, landing on her elbow forcing her shoulders superiorly.  She has been having quite a bit of trouble since that time with difficulty sleeping and pain with overhead activity.  She has had an MRI scan revealing a large full-thickness retracted tear of the supraspinatus measuring 3.5 cm in diameter.  She had slight atrophy of the supraspinatus and normal AC joint and type 2 acromion that could cause impingement.  With a poor response to time with conservative treatment, she is now to have an arthroscopic evaluation with an old rotator cuff tear repair.  PROCEDURE:  Ms. Setzler was met with her husband in the holding area, identified the right shoulder as appropriate operative site and signed it.  She did have a preoperative interscalene nerve block by anesthesia. The patient was then transported to room #6 and placed under  general anesthesia without difficulty.  Time-out was called.  Examination of the right shoulder revealed no evidence of adhesive capsulitis or instability.  Shoulder was then prepped with DuraPrep in the base of the neck circumferentially below the elbow.  Sterile draping was performed.  A marking pen was used to outline the [facility] joint, the coracoid and the acromion.  At that point, a fingerbreadth posterior and medial to the posterior angle acromion, a small stab wound was made.  The arthroscope was then placed into the shoulder joint without difficulty.  Diagnostic arthroscopy revealed diffuse beefy red synovitis anteriorly, posteriorly and inferiorly.  There were no loose bodies.  I did not see appreciable chondromalacia of the humeral head or glenoid.  The biceps appeared to be intact, but there was partial tearing of the subscapularis, and considerable fraying of the anterior glenoid from about the 12 to about 4 o'clock position.  The second portal was established anteriorly and I debrided the frayed labrum and the synovitis, I could easily visualize the rotator cuff tear and see the subacromial space.  The arthroscope was then placed in the subacromial space posteriorly, the cannula in the subacromial space anteriorly and a third portal established in the lateral subacromial space.  An arthroscopic subacromial decompression was performed.  There was moderate amount of beefy  red inflammatory bursal tissue in the subacromial space.  There was some bleeding, which I controlled with the ArthroCare wand.  There was both anterior and lateral overhang of the acromion.  This was removed with a 6-mm bur with a nice flat resection.  The [facility] joint was left intact.  I could visualized the rotator cuff tear with the visualized humeral head through the bursal surface.  The instruments were then removed.  Mini-open rotator cuff tear repair was then performed.  About an inch incision was  made over the anterior aspect of the shoulder via sharp dissection, carried down to the subcutaneous tissue.  A raphe within the deltoid fascia was identified and incised.  The subacromial space was entered and irrigated.  Self- retaining retractor was inserted.  The rotator cuff tear was identified, it was involved predominantly at the supraspinatus.  It was V shaped,  extending probably 3 cm superiorly.  I was able to retrieve the edges, sharp them up with a 15- blade knife and then repaired the cuff from its apex to the humeral head.  At that point, I inserted a single PEEK 5.5-mm anchor with two #2 FiberWires attached.  These were used to secure the very end of the cuff tear.  At that point, I thought I had a very nice repair, there was no tension and as I placed the arm through full range of motion, it did not appear to be any impingement or motion at the repair site.  Wound was irrigated with saline solution.  The deltoid fascia was then closed with running 0 Vicryl, subcu with 3-0 Monocryl, skin closed with Steri-Strips over benzoin.  Sterile bulky dressing was applied followed by a sling.  PLAN:  Percocet for pain.  Office, Monday.     Claude Manges. Cleophas Dunker, M.D.     PWW/MEDQ  D:  12/12/2012  T:  12/13/2012  Job:  [id number]

## 2013-01-01 ENCOUNTER — Ambulatory Visit: Payer: BC Managed Care – PPO | Attending: Orthopaedic Surgery | Admitting: Rehabilitation

## 2013-01-01 DIAGNOSIS — IMO0001 Reserved for inherently not codable concepts without codable children: Secondary | ICD-10-CM | POA: Insufficient documentation

## 2013-01-01 DIAGNOSIS — M25619 Stiffness of unspecified shoulder, not elsewhere classified: Secondary | ICD-10-CM | POA: Insufficient documentation

## 2013-01-01 DIAGNOSIS — M25519 Pain in unspecified shoulder: Secondary | ICD-10-CM | POA: Insufficient documentation

## 2013-01-04 ENCOUNTER — Ambulatory Visit: Payer: BC Managed Care – PPO | Admitting: Rehabilitation

## 2013-01-08 ENCOUNTER — Ambulatory Visit: Payer: BC Managed Care – PPO | Admitting: Rehabilitation

## 2013-01-10 ENCOUNTER — Ambulatory Visit: Payer: BC Managed Care – PPO | Admitting: Rehabilitation

## 2013-01-12 ENCOUNTER — Ambulatory Visit: Payer: BC Managed Care – PPO | Admitting: Rehabilitation

## 2013-01-16 ENCOUNTER — Ambulatory Visit: Payer: BC Managed Care – PPO | Admitting: Rehabilitation

## 2013-01-17 ENCOUNTER — Ambulatory Visit: Payer: BC Managed Care – PPO | Admitting: Rehabilitation

## 2013-01-22 ENCOUNTER — Ambulatory Visit: Payer: BC Managed Care – PPO | Admitting: Rehabilitation

## 2013-01-25 ENCOUNTER — Ambulatory Visit: Payer: BC Managed Care – PPO | Admitting: Rehabilitation

## 2013-01-29 ENCOUNTER — Ambulatory Visit: Payer: BC Managed Care – PPO | Admitting: Rehabilitation

## 2013-02-01 ENCOUNTER — Ambulatory Visit: Payer: BC Managed Care – PPO | Attending: Orthopaedic Surgery | Admitting: Rehabilitation

## 2013-02-01 DIAGNOSIS — M25619 Stiffness of unspecified shoulder, not elsewhere classified: Secondary | ICD-10-CM | POA: Insufficient documentation

## 2013-02-01 DIAGNOSIS — IMO0001 Reserved for inherently not codable concepts without codable children: Secondary | ICD-10-CM | POA: Insufficient documentation

## 2013-02-01 DIAGNOSIS — M25519 Pain in unspecified shoulder: Secondary | ICD-10-CM | POA: Insufficient documentation

## 2013-02-05 ENCOUNTER — Ambulatory Visit: Payer: BC Managed Care – PPO | Admitting: Rehabilitation

## 2013-02-08 ENCOUNTER — Ambulatory Visit: Payer: BC Managed Care – PPO | Admitting: Rehabilitation

## 2013-02-13 ENCOUNTER — Ambulatory Visit: Payer: BC Managed Care – PPO | Admitting: Rehabilitation

## 2013-02-15 ENCOUNTER — Ambulatory Visit: Payer: BC Managed Care – PPO | Admitting: Rehabilitation

## 2013-02-19 ENCOUNTER — Ambulatory Visit: Payer: BC Managed Care – PPO | Admitting: Rehabilitation

## 2013-02-22 ENCOUNTER — Ambulatory Visit: Payer: BC Managed Care – PPO | Admitting: Rehabilitation

## 2014-02-18 ENCOUNTER — Ambulatory Visit (INDEPENDENT_AMBULATORY_CARE_PROVIDER_SITE_OTHER): Payer: Medicare Other | Admitting: Podiatry

## 2014-02-18 ENCOUNTER — Ambulatory Visit (INDEPENDENT_AMBULATORY_CARE_PROVIDER_SITE_OTHER): Payer: Medicare Other

## 2014-02-18 ENCOUNTER — Encounter: Payer: Self-pay | Admitting: Podiatry

## 2014-02-18 VITALS — BP 130/74 | HR 73 | Resp 16 | Ht 63.5 in | Wt 140.0 lb

## 2014-02-18 DIAGNOSIS — M722 Plantar fascial fibromatosis: Secondary | ICD-10-CM

## 2014-02-18 MED ORDER — TRIAMCINOLONE ACETONIDE 10 MG/ML IJ SUSP
10.0000 mg | Freq: Once | INTRAMUSCULAR | Status: AC
  Administered 2014-02-18: 10 mg

## 2014-02-18 NOTE — Patient Instructions (Signed)

## 2014-02-18 NOTE — Progress Notes (Signed)
   Subjective:    Patient ID: [name], female    DOB: 06/01/1946, 68 y.o.   MRN: [id number]  HPI Comments: "I have pain in the heel"  Patient c/o sharp plantar heel pain left for over 1 month. She does have AM pain and stiffness. She did see PCP and recommended OTC insoles, stretching, and she has tried Aleve. No help. Trouble walking for long periods.  Foot Pain Associated symptoms include arthralgias, headaches and myalgias.      Review of Systems  Constitutional: Positive for activity change.  Musculoskeletal: Positive for arthralgias, gait problem and myalgias.  Neurological: Positive for headaches.  All other systems reviewed and are negative.       Objective:   Physical Exam        Assessment & Plan:

## 2014-02-19 NOTE — Progress Notes (Signed)
Subjective:     Patient ID: LU PARADISE, female   DOB: [birth year], 68 y.o.   MRN: [id number]  Foot Pain   patient presents complaining of pain in the left heel that has been present for over a month. Saw her physician who recommended orthotics over-the-counter stretching which have not been helpful   Review of Systems  All other systems reviewed and are negative.       Objective:   Physical Exam  Nursing note and vitals reviewed. Constitutional: She is oriented to person, place, and time.  Cardiovascular: Intact distal pulses.   Musculoskeletal: Normal range of motion.  Neurological: She is oriented to person, place, and time.  Skin: Skin is warm.   neurovascular status intact with intense discomfort medial fascial band left at the insertion of the fascia to the calcaneus. Muscle strength adequate and range of motion subtalar and midtarsal joint is within normal limits. Arch height was identified and found to be within normal limits     Assessment:     Plantar fasciitis left at the insertional point in the calcaneus with inflammation and fluid buildup    Plan:     H&P and x-ray reviewed with patient and injected the left plantar fascia 3 mg Kenalog 5 mg Xylocaine Marcaine mixture and applied fascially brace. Gave instructions on physical therapy and reduced activity and reappoint her recheck in the next several weeks

## 2014-02-21 ENCOUNTER — Ambulatory Visit: Payer: Self-pay | Admitting: Podiatry

## 2014-03-07 ENCOUNTER — Encounter: Payer: Self-pay | Admitting: Podiatry

## 2014-03-07 ENCOUNTER — Ambulatory Visit (INDEPENDENT_AMBULATORY_CARE_PROVIDER_SITE_OTHER): Payer: Medicare Other | Admitting: Podiatry

## 2014-03-07 VITALS — BP 132/87 | HR 75 | Resp 17 | Ht 63.5 in | Wt 145.0 lb

## 2014-03-07 DIAGNOSIS — M722 Plantar fascial fibromatosis: Secondary | ICD-10-CM

## 2014-03-07 NOTE — Progress Notes (Signed)
   Subjective:    Patient ID: [name], female    DOB: 1946/05/27, 68 y.o.   MRN: [id number] Pt states she has worn the plantar fascia brace until last week, and it became uncomfortable. HPI    Review of Systems     Objective:   Physical Exam        Assessment & Plan:

## 2014-03-08 NOTE — Progress Notes (Signed)
Subjective:     Patient ID: [name], female   DOB: May 12, 1946, 68 y.o.   MRN: [id number]  HPI patient states that the heel is feeling quite a bit better but still sore if she has been on it all day   Review of Systems     Objective:   Physical Exam Neurovascular status intact with no health history changes noted and range of motion subtalar midtarsal joint within normal limits with discomfort in the plantar fascia that is present when pressed deeply    Assessment:     Plantar fasciitis left it's improving but still is painful    Plan:     Gave instructions on physical therapy anti-inflammatories and supportive shoe gear usage. Reappoint for followup if symptoms persist

## 2015-03-19 ENCOUNTER — Other Ambulatory Visit: Payer: Self-pay | Admitting: Neurology

## 2015-03-19 DIAGNOSIS — R51 Headache: Principal | ICD-10-CM

## 2015-03-19 DIAGNOSIS — R519 Headache, unspecified: Secondary | ICD-10-CM

## 2015-04-06 ENCOUNTER — Ambulatory Visit
Admission: RE | Admit: 2015-04-06 | Discharge: 2015-04-06 | Disposition: A | Discharge status: Home / Self Care | Payer: Self-pay | Source: Ambulatory Visit | Attending: Neurology | Admitting: Neurology

## 2015-04-06 ENCOUNTER — Ambulatory Visit
Admission: RE | Admit: 2015-04-06 | Discharge: 2015-04-06 | Disposition: A | Discharge status: Home / Self Care | Payer: Medicare Other | Source: Ambulatory Visit | Attending: Neurology | Admitting: Neurology

## 2015-04-06 DIAGNOSIS — R51 Headache: Principal | ICD-10-CM

## 2015-04-06 DIAGNOSIS — R519 Headache, unspecified: Secondary | ICD-10-CM

## 2017-01-07 DIAGNOSIS — M545 Low back pain: Secondary | ICD-10-CM | POA: Diagnosis not present

## 2017-03-11 DIAGNOSIS — R208 Other disturbances of skin sensation: Secondary | ICD-10-CM | POA: Diagnosis not present

## 2017-03-11 DIAGNOSIS — L400 Psoriasis vulgaris: Secondary | ICD-10-CM | POA: Diagnosis not present

## 2017-03-23 DIAGNOSIS — G43019 Migraine without aura, intractable, without status migrainosus: Secondary | ICD-10-CM | POA: Diagnosis not present

## 2017-03-23 DIAGNOSIS — G4485 Primary stabbing headache: Secondary | ICD-10-CM | POA: Diagnosis not present

## 2017-03-23 DIAGNOSIS — F419 Anxiety disorder, unspecified: Secondary | ICD-10-CM | POA: Diagnosis not present

## 2017-03-23 DIAGNOSIS — G44219 Episodic tension-type headache, not intractable: Secondary | ICD-10-CM | POA: Diagnosis not present

## 2017-03-28 DIAGNOSIS — Z01419 Encounter for gynecological examination (general) (routine) without abnormal findings: Secondary | ICD-10-CM | POA: Diagnosis not present

## 2017-03-28 DIAGNOSIS — Z6825 Body mass index (BMI) 25.0-25.9, adult: Secondary | ICD-10-CM | POA: Diagnosis not present

## 2017-03-28 DIAGNOSIS — Z1231 Encounter for screening mammogram for malignant neoplasm of breast: Secondary | ICD-10-CM | POA: Diagnosis not present

## 2017-03-28 DIAGNOSIS — N3945 Continuous leakage: Secondary | ICD-10-CM | POA: Diagnosis not present

## 2017-03-28 DIAGNOSIS — N760 Acute vaginitis: Secondary | ICD-10-CM | POA: Diagnosis not present

## 2017-05-05 DIAGNOSIS — Z1159 Encounter for screening for other viral diseases: Secondary | ICD-10-CM | POA: Diagnosis not present

## 2017-05-05 DIAGNOSIS — I10 Essential (primary) hypertension: Secondary | ICD-10-CM | POA: Diagnosis not present

## 2017-05-05 DIAGNOSIS — J4540 Moderate persistent asthma, uncomplicated: Secondary | ICD-10-CM | POA: Diagnosis not present

## 2017-05-05 DIAGNOSIS — Z23 Encounter for immunization: Secondary | ICD-10-CM | POA: Diagnosis not present

## 2017-05-05 DIAGNOSIS — M1990 Unspecified osteoarthritis, unspecified site: Secondary | ICD-10-CM | POA: Diagnosis not present

## 2017-05-05 DIAGNOSIS — E039 Hypothyroidism, unspecified: Secondary | ICD-10-CM | POA: Diagnosis not present

## 2017-05-05 DIAGNOSIS — Z0000 Encounter for general adult medical examination without abnormal findings: Secondary | ICD-10-CM | POA: Diagnosis not present

## 2017-05-05 DIAGNOSIS — E7800 Pure hypercholesterolemia, unspecified: Secondary | ICD-10-CM | POA: Diagnosis not present

## 2017-05-10 DIAGNOSIS — S0990XA Unspecified injury of head, initial encounter: Secondary | ICD-10-CM | POA: Diagnosis not present

## 2017-05-10 DIAGNOSIS — S0090XA Unspecified superficial injury of unspecified part of head, initial encounter: Secondary | ICD-10-CM | POA: Diagnosis not present

## 2017-05-10 DIAGNOSIS — Z87891 Personal history of nicotine dependence: Secondary | ICD-10-CM | POA: Diagnosis not present

## 2017-05-10 DIAGNOSIS — S022XXA Fracture of nasal bones, initial encounter for closed fracture: Secondary | ICD-10-CM | POA: Diagnosis not present

## 2017-05-10 DIAGNOSIS — M25561 Pain in right knee: Secondary | ICD-10-CM | POA: Diagnosis not present

## 2017-05-10 DIAGNOSIS — S199XXA Unspecified injury of neck, initial encounter: Secondary | ICD-10-CM | POA: Diagnosis not present

## 2017-05-10 DIAGNOSIS — S0081XA Abrasion of other part of head, initial encounter: Secondary | ICD-10-CM | POA: Diagnosis not present

## 2017-05-10 DIAGNOSIS — S8991XA Unspecified injury of right lower leg, initial encounter: Secondary | ICD-10-CM | POA: Diagnosis not present

## 2017-05-10 DIAGNOSIS — S0083XA Contusion of other part of head, initial encounter: Secondary | ICD-10-CM | POA: Diagnosis not present

## 2017-05-30 DIAGNOSIS — G44219 Episodic tension-type headache, not intractable: Secondary | ICD-10-CM | POA: Diagnosis not present

## 2017-05-30 DIAGNOSIS — F419 Anxiety disorder, unspecified: Secondary | ICD-10-CM | POA: Diagnosis not present

## 2017-05-30 DIAGNOSIS — G43009 Migraine without aura, not intractable, without status migrainosus: Secondary | ICD-10-CM | POA: Diagnosis not present

## 2017-07-13 DIAGNOSIS — H524 Presbyopia: Secondary | ICD-10-CM | POA: Diagnosis not present

## 2017-07-13 DIAGNOSIS — H26493 Other secondary cataract, bilateral: Secondary | ICD-10-CM | POA: Diagnosis not present

## 2017-07-13 DIAGNOSIS — H04123 Dry eye syndrome of bilateral lacrimal glands: Secondary | ICD-10-CM | POA: Diagnosis not present

## 2017-07-19 DIAGNOSIS — M150 Primary generalized (osteo)arthritis: Secondary | ICD-10-CM | POA: Diagnosis not present

## 2017-07-19 DIAGNOSIS — R5383 Other fatigue: Secondary | ICD-10-CM | POA: Diagnosis not present

## 2017-07-19 DIAGNOSIS — Z6825 Body mass index (BMI) 25.0-25.9, adult: Secondary | ICD-10-CM | POA: Diagnosis not present

## 2017-07-19 DIAGNOSIS — L409 Psoriasis, unspecified: Secondary | ICD-10-CM | POA: Diagnosis not present

## 2017-07-19 DIAGNOSIS — M542 Cervicalgia: Secondary | ICD-10-CM | POA: Diagnosis not present

## 2017-07-19 DIAGNOSIS — E663 Overweight: Secondary | ICD-10-CM | POA: Diagnosis not present

## 2017-07-19 DIAGNOSIS — M2550 Pain in unspecified joint: Secondary | ICD-10-CM | POA: Diagnosis not present

## 2017-07-19 DIAGNOSIS — J449 Chronic obstructive pulmonary disease, unspecified: Secondary | ICD-10-CM | POA: Diagnosis not present

## 2017-07-19 DIAGNOSIS — M154 Erosive (osteo)arthritis: Secondary | ICD-10-CM | POA: Diagnosis not present

## 2017-08-22 DIAGNOSIS — M150 Primary generalized (osteo)arthritis: Secondary | ICD-10-CM | POA: Diagnosis not present

## 2017-08-22 DIAGNOSIS — E663 Overweight: Secondary | ICD-10-CM | POA: Diagnosis not present

## 2017-08-22 DIAGNOSIS — J449 Chronic obstructive pulmonary disease, unspecified: Secondary | ICD-10-CM | POA: Diagnosis not present

## 2017-08-22 DIAGNOSIS — M154 Erosive (osteo)arthritis: Secondary | ICD-10-CM | POA: Diagnosis not present

## 2017-08-22 DIAGNOSIS — M2550 Pain in unspecified joint: Secondary | ICD-10-CM | POA: Diagnosis not present

## 2017-08-22 DIAGNOSIS — L409 Psoriasis, unspecified: Secondary | ICD-10-CM | POA: Diagnosis not present

## 2017-08-22 DIAGNOSIS — Z6825 Body mass index (BMI) 25.0-25.9, adult: Secondary | ICD-10-CM | POA: Diagnosis not present

## 2018-01-03 DIAGNOSIS — E663 Overweight: Secondary | ICD-10-CM | POA: Diagnosis not present

## 2018-01-03 DIAGNOSIS — M150 Primary generalized (osteo)arthritis: Secondary | ICD-10-CM | POA: Diagnosis not present

## 2018-01-03 DIAGNOSIS — M154 Erosive (osteo)arthritis: Secondary | ICD-10-CM | POA: Diagnosis not present

## 2018-01-03 DIAGNOSIS — Z6825 Body mass index (BMI) 25.0-25.9, adult: Secondary | ICD-10-CM | POA: Diagnosis not present

## 2018-01-03 DIAGNOSIS — M2550 Pain in unspecified joint: Secondary | ICD-10-CM | POA: Diagnosis not present

## 2018-01-03 DIAGNOSIS — Z79899 Other long term (current) drug therapy: Secondary | ICD-10-CM | POA: Diagnosis not present

## 2018-01-03 DIAGNOSIS — L409 Psoriasis, unspecified: Secondary | ICD-10-CM | POA: Diagnosis not present

## 2018-02-20 DIAGNOSIS — E039 Hypothyroidism, unspecified: Secondary | ICD-10-CM | POA: Diagnosis not present

## 2018-02-20 DIAGNOSIS — Z1159 Encounter for screening for other viral diseases: Secondary | ICD-10-CM | POA: Diagnosis not present

## 2018-02-20 DIAGNOSIS — G43909 Migraine, unspecified, not intractable, without status migrainosus: Secondary | ICD-10-CM | POA: Diagnosis not present

## 2018-02-20 DIAGNOSIS — J4540 Moderate persistent asthma, uncomplicated: Secondary | ICD-10-CM | POA: Diagnosis not present

## 2018-02-20 DIAGNOSIS — I10 Essential (primary) hypertension: Secondary | ICD-10-CM | POA: Diagnosis not present

## 2018-04-12 DIAGNOSIS — Z124 Encounter for screening for malignant neoplasm of cervix: Secondary | ICD-10-CM | POA: Diagnosis not present

## 2018-04-12 DIAGNOSIS — Z6825 Body mass index (BMI) 25.0-25.9, adult: Secondary | ICD-10-CM | POA: Diagnosis not present

## 2018-04-12 DIAGNOSIS — Z01419 Encounter for gynecological examination (general) (routine) without abnormal findings: Secondary | ICD-10-CM | POA: Diagnosis not present

## 2018-04-12 DIAGNOSIS — Z1231 Encounter for screening mammogram for malignant neoplasm of breast: Secondary | ICD-10-CM | POA: Diagnosis not present

## 2018-05-26 DIAGNOSIS — E039 Hypothyroidism, unspecified: Secondary | ICD-10-CM | POA: Diagnosis not present

## 2018-05-26 DIAGNOSIS — G43909 Migraine, unspecified, not intractable, without status migrainosus: Secondary | ICD-10-CM | POA: Diagnosis not present

## 2018-05-26 DIAGNOSIS — Z6825 Body mass index (BMI) 25.0-25.9, adult: Secondary | ICD-10-CM | POA: Diagnosis not present

## 2018-05-26 DIAGNOSIS — Z79899 Other long term (current) drug therapy: Secondary | ICD-10-CM | POA: Diagnosis not present

## 2018-05-26 DIAGNOSIS — F325 Major depressive disorder, single episode, in full remission: Secondary | ICD-10-CM | POA: Diagnosis not present

## 2018-05-26 DIAGNOSIS — E7800 Pure hypercholesterolemia, unspecified: Secondary | ICD-10-CM | POA: Diagnosis not present

## 2018-05-26 DIAGNOSIS — I10 Essential (primary) hypertension: Secondary | ICD-10-CM | POA: Diagnosis not present

## 2018-05-26 DIAGNOSIS — K219 Gastro-esophageal reflux disease without esophagitis: Secondary | ICD-10-CM | POA: Diagnosis not present

## 2018-06-08 DIAGNOSIS — Z961 Presence of intraocular lens: Secondary | ICD-10-CM | POA: Diagnosis not present

## 2018-06-08 DIAGNOSIS — H26493 Other secondary cataract, bilateral: Secondary | ICD-10-CM | POA: Diagnosis not present

## 2018-06-08 DIAGNOSIS — H04123 Dry eye syndrome of bilateral lacrimal glands: Secondary | ICD-10-CM | POA: Diagnosis not present

## 2018-06-08 DIAGNOSIS — H43813 Vitreous degeneration, bilateral: Secondary | ICD-10-CM | POA: Diagnosis not present

## 2018-07-05 DIAGNOSIS — M150 Primary generalized (osteo)arthritis: Secondary | ICD-10-CM | POA: Diagnosis not present

## 2018-07-05 DIAGNOSIS — L409 Psoriasis, unspecified: Secondary | ICD-10-CM | POA: Diagnosis not present

## 2018-07-05 DIAGNOSIS — Z6826 Body mass index (BMI) 26.0-26.9, adult: Secondary | ICD-10-CM | POA: Diagnosis not present

## 2018-07-05 DIAGNOSIS — M2550 Pain in unspecified joint: Secondary | ICD-10-CM | POA: Diagnosis not present

## 2018-07-05 DIAGNOSIS — M154 Erosive (osteo)arthritis: Secondary | ICD-10-CM | POA: Diagnosis not present

## 2018-07-05 DIAGNOSIS — E663 Overweight: Secondary | ICD-10-CM | POA: Diagnosis not present

## 2018-07-05 DIAGNOSIS — Z79899 Other long term (current) drug therapy: Secondary | ICD-10-CM | POA: Diagnosis not present

## 2018-07-27 DIAGNOSIS — H43813 Vitreous degeneration, bilateral: Secondary | ICD-10-CM | POA: Diagnosis not present

## 2018-07-27 DIAGNOSIS — H04123 Dry eye syndrome of bilateral lacrimal glands: Secondary | ICD-10-CM | POA: Diagnosis not present

## 2018-07-27 DIAGNOSIS — H26493 Other secondary cataract, bilateral: Secondary | ICD-10-CM | POA: Diagnosis not present

## 2018-08-01 DIAGNOSIS — M5136 Other intervertebral disc degeneration, lumbar region: Secondary | ICD-10-CM | POA: Diagnosis not present

## 2018-08-01 DIAGNOSIS — M5416 Radiculopathy, lumbar region: Secondary | ICD-10-CM | POA: Diagnosis not present

## 2018-08-01 DIAGNOSIS — M51369 Other intervertebral disc degeneration, lumbar region without mention of lumbar back pain or lower extremity pain: Secondary | ICD-10-CM | POA: Insufficient documentation

## 2018-08-09 ENCOUNTER — Ambulatory Visit (INDEPENDENT_AMBULATORY_CARE_PROVIDER_SITE_OTHER): Payer: Medicare HMO | Admitting: Physical Therapy

## 2018-08-09 ENCOUNTER — Encounter: Payer: Self-pay | Admitting: Physical Therapy

## 2018-08-09 DIAGNOSIS — M6281 Muscle weakness (generalized): Secondary | ICD-10-CM

## 2018-08-09 DIAGNOSIS — G8929 Other chronic pain: Secondary | ICD-10-CM | POA: Diagnosis not present

## 2018-08-09 DIAGNOSIS — R293 Abnormal posture: Secondary | ICD-10-CM | POA: Diagnosis not present

## 2018-08-09 DIAGNOSIS — M545 Low back pain, unspecified: Secondary | ICD-10-CM

## 2018-08-09 NOTE — Patient Instructions (Signed)
Access Code: [email or web address]  URL: [email or web address]  Date: 08/09/2018  Prepared by: Faustino Congress   Exercises  Sidelying Hip Abduction - 10 reps - 3 sets - 1x daily - 7x weekly  Supine Bridge - 10 reps - 1 sets - 5 sec hold - 2x daily - 7x weekly  Supine Posterior Pelvic Tilt - 10 reps - 1 sets - 5 sec hold - 2x daily - 7x weekly  Supine Piriformis Stretch with Foot on Ground - 3 reps - 1 sets - 30 sec hold - 2x daily - 7x weekly  Supine Hamstring Stretch with Strap - 3 reps - 1 sets - 30 sec hold - 2x daily - 7x weekly

## 2018-08-09 NOTE — Therapy (Signed)
[place], [place], [address] Phone: [phone]   Fax:  [phone]  Physical Therapy Evaluation  Patient Details  Name: [name] MRN: [id number] Date of Birth: 11/22/1946 Referring Provider: Dr. Suella Broad   Encounter Date: 08/09/2018  PT End of Session - 08/09/18 0927    Visit Number  1    Number of Visits  12    Date for PT Re-Evaluation  09/20/18    Authorization Type  [employer]    PT Start Time  0846    PT Stop Time  0924    PT Time Calculation (min)  38 min    Activity Tolerance  Patient tolerated treatment well    Behavior During Therapy  [facility] for tasks assessed/performed       Past Medical History:  Diagnosis Date  . Anxiety   . Arthritis   . Asthma   . Depression   . GERD (gastroesophageal reflux disease)   . Headache(784.0)   . Hypertension   . Hypothyroidism   . PONV (postoperative nausea and vomiting)   . Wears glasses     Past Surgical History:  Procedure Laterality Date  . ABDOMINAL HYSTERECTOMY  1981  . APPENDECTOMY     age 21  . CHOLECYSTECTOMY  1/10   lap choli  . COLONOSCOPY    . CYSTOCELE REPAIR  8/10   ant/post-rectocele/cystocele repair  . OVARY SURGERY  1995  . SHOULDER ARTHROSCOPY WITH ROTATOR CUFF REPAIR AND SUBACROMIAL DECOMPRESSION  12/12/2012   Procedure: SHOULDER ARTHROSCOPY WITH ROTATOR CUFF REPAIR AND SUBACROMIAL DECOMPRESSION;  Surgeon: Garald Balding, MD;  Location: [place];  Service: Orthopedics;  Laterality: Right;  Right Shoulder Mini Open Rotator Cuff Repair and Subacromial Decompression, possible Patch.  . TONSILLECTOMY     age 11  . TUBAL LIGATION  1976    There were no vitals filed for this visit.   Subjective Assessment - 08/09/18 0850    Subjective  Pt is a 72 y/o female who presents to OPPT for chronic low back pain.  Pt reports pain with housework, especially with bending activities.  Pt reports she was  active walking on treadmill, but had to stop due to summer (TM in garage).      Pertinent History  migraines, anxiety, asthma, depression, HTN, arthritis    Limitations  Standing;House hold activities    How long can you stand comfortably?  30-40 min    Diagnostic tests  xray: arthritis    Patient Stated Goals  improve pain    Currently in Pain?  Yes    Pain Score  0-No pain   up to 7/10   Pain Location  Back    Pain Orientation  Mid;Lower    Pain Descriptors / Indicators  Tightness;Dull;Aching;Sharp    Pain Type  Chronic pain    Pain Onset  More than a month ago    Pain Frequency  Intermittent    Aggravating Factors   forward bending, lying down (takes a while to get comfortable)    Pain Relieving Factors  sit down, has tried heat         [facility] PT Assessment - 08/09/18 0855      Assessment   Medical Diagnosis  Disc Degeneration; Lumbar Region    Referring Provider  Dr. Suella Broad    Onset Date/Surgical Date  --   1 year ago   Next MD Visit  PRN    Prior  Therapy  unrelated to this condition      Precautions   Precautions  None      Restrictions   Weight Bearing Restrictions  No      Balance Screen   Has the patient fallen in the past 6 months  No    Has the patient had a decrease in activity level because of a fear of falling?   No    Is the patient reluctant to leave their home because of a fear of falling?   No      Home Environment   Living Environment  Private residence    Living Arrangements  Spouse/significant other    Type of [place] to enter    Entrance Stairs-Number of Steps  2    [place]  One level      Prior Function   Level of Independence  Independent    Vocation  Retired    Biomedical scientist  retired from office work    Leisure  walked on treadmill daily (stopped due to heat), travel      Cognition   Overall Cognitive Status  Within Functional Limits for tasks assessed       Observation/Other Assessments   Focus on Therapeutic Outcomes (FOTO)   44 (56% limited; predicted 46% limited)      Posture/Postural Control   Posture/Postural Control  Postural limitations    Postural Limitations  Rounded Shoulders;Forward head;Decreased lumbar lordosis      ROM / Strength   AROM / PROM / Strength  AROM;Strength      AROM   Overall AROM Comments  lumbar ROM WNL except rotation limited 50%; pain with all motions      Strength   Strength Assessment Site  Hip;Knee;Ankle    Right/Left Hip  Right;Left    Right Hip Flexion  3+/5    Right Hip Extension  3/5    Right Hip ABduction  3/5    Left Hip Flexion  3+/5    Left Hip Extension  3/5    Left Hip ABduction  3/5    Right/Left Knee  Right;Left    Right Knee Flexion  5/5    Right Knee Extension  5/5    Left Knee Flexion  5/5    Left Knee Extension  5/5    Right/Left Ankle  Right;Left    Right Ankle Dorsiflexion  5/5    Left Ankle Dorsiflexion  5/5      Flexibility   Soft Tissue Assessment /Muscle Length  yes    Hamstrings  tightness bil    Piriformis  tightness bil      Palpation   Palpation comment  tenderness lumbar paraspinals and glutes bil      Special Tests    Special Tests  Lumbar    Lumbar Tests  Straight Leg Raise      Straight Leg Raise   Findings  Negative                Objective measurements completed on examination: See above findings.      [facility] Adult PT Treatment/Exercise - 08/09/18 0855      Exercises   Exercises  Lumbar      Lumbar Exercises: Stretches   Passive Hamstring Stretch  Right;Left;1 rep;30 seconds    Piriformis Stretch  Right;Left;1 rep;30 seconds      Lumbar Exercises: Supine   Pelvic Tilt  5 reps;5 seconds    Bridge  5 reps;5 seconds      Lumbar Exercises: Sidelying   Hip Abduction  Both;5 reps             PT Education - 08/09/18 0926    Education Details  HEP    Person(s) Educated  Patient    Methods  Explanation;Handout;Demonstration     Comprehension  Verbalized understanding;Returned demonstration;Need further instruction          PT Long Term Goals - 08/09/18 1212      PT LONG TERM GOAL #1   Title  independent with HEP    Status  New    Target Date  09/20/18      PT LONG TERM GOAL #2   Title  verbalize understanding of posture/body mechanics to decrease risk of reinjury    Status  New    Target Date  09/20/18      PT LONG TERM GOAL #3   Title  demonstrate 4/5 bil hip strength for improved function and mobility    Status  New    Target Date  09/20/18      PT LONG TERM GOAL #4   Title  report ability to perform housework with pain < 3/10 for improved function    Status  New    Target Date  09/20/18             Plan - 08/09/18 0927    Clinical Impression Statement  Pt is a 72 y/o female who presents to OPPT for LBP x 1 year.  Pt reports intermittent pain and demonstrates decreased strength and flexibility affecting functional mobility.  Pt also with trigger points in bil glutes and may benefit from manual therapy and DN to these areas.  Will benefit from PT to address deficits listed.    History and Personal Factors relevant to plan of care:  migraines, anxiety, asthma, depression, HTN, arthritis    Clinical Presentation  Stable    Clinical Decision Making  Low    Rehab Potential  Good    PT Frequency  2x / week    PT Duration  6 weeks    PT Treatment/Interventions  ADLs/Self Care Home Management;Electrical Stimulation;Cryotherapy;Moist Heat;Traction;Therapeutic exercise;Therapeutic activities;Functional mobility training;Stair training;Gait training;Balance training;Ultrasound;Neuromuscular re-education;Patient/family education;Manual techniques;Taping;Dry needling    PT Next Visit Plan  review HEP, manual/modalities/DN PRN for pain     PT Home Exercise Plan  Access Code: [email or web address]     Consulted and Agree with Plan of Care  Patient       Patient will benefit from skilled therapeutic intervention in  order to improve the following deficits and impairments:  Pain, Increased fascial restricitons, Increased muscle spasms, Decreased mobility, Impaired flexibility, Postural dysfunction, Decreased strength  Visit Diagnosis: Chronic midline low back pain without sciatica - Plan: PT plan of care cert/re-cert  Abnormal posture - Plan: PT plan of care cert/re-cert  Muscle weakness (generalized) - Plan: PT plan of care cert/re-cert     Problem List There are no active problems to display for this patient.    Laureen Abrahams, PT, DPT 08/09/18 12:16 PM    [facility] [place], [place], [address] Phone: [phone]   Fax:  [phone]  Name: [name] MRN: [id number] Date of Birth: 07-May-1946

## 2018-08-11 ENCOUNTER — Ambulatory Visit: Payer: Medicare HMO | Admitting: Physical Therapy

## 2018-08-11 ENCOUNTER — Encounter: Payer: Self-pay | Admitting: Physical Therapy

## 2018-08-11 DIAGNOSIS — M6281 Muscle weakness (generalized): Secondary | ICD-10-CM

## 2018-08-11 DIAGNOSIS — M545 Low back pain, unspecified: Secondary | ICD-10-CM

## 2018-08-11 DIAGNOSIS — G8929 Other chronic pain: Secondary | ICD-10-CM

## 2018-08-11 DIAGNOSIS — R293 Abnormal posture: Secondary | ICD-10-CM | POA: Diagnosis not present

## 2018-08-11 NOTE — Therapy (Signed)
[place], [place], [address] Phone: [phone]   Fax:  [phone]  Physical Therapy Treatment  Patient Details  Name: [name] MRN: [id number] Date of Birth: [birth year] Referring Provider: Dr. Suella Broad   Encounter Date: 08/11/2018  PT End of Session - 08/11/18 1532    Visit Number  2    Number of Visits  12    Date for PT Re-Evaluation  09/20/18    Authorization Type  [employer]    PT Start Time  1532    PT Stop Time  1620   MHP last 10 min    PT Time Calculation (min)  48 min    Activity Tolerance  Patient tolerated treatment well    Behavior During Therapy  [facility] for tasks assessed/performed       Past Medical History:  Diagnosis Date  . Anxiety   . Arthritis   . Asthma   . Depression   . GERD (gastroesophageal reflux disease)   . Headache(784.0)   . Hypertension   . Hypothyroidism   . PONV (postoperative nausea and vomiting)   . Wears glasses     Past Surgical History:  Procedure Laterality Date  . ABDOMINAL HYSTERECTOMY  1981  . APPENDECTOMY     age 39  . CHOLECYSTECTOMY  1/10   lap choli  . COLONOSCOPY    . CYSTOCELE REPAIR  8/10   ant/post-rectocele/cystocele repair  . OVARY SURGERY  1995  . SHOULDER ARTHROSCOPY WITH ROTATOR CUFF REPAIR AND SUBACROMIAL DECOMPRESSION  12/12/2012   Procedure: SHOULDER ARTHROSCOPY WITH ROTATOR CUFF REPAIR AND SUBACROMIAL DECOMPRESSION;  Surgeon: Garald Balding, MD;  Location: [place];  Service: Orthopedics;  Laterality: Right;  Right Shoulder Mini Open Rotator Cuff Repair and Subacromial Decompression, possible Patch.  . TONSILLECTOMY     age 78  . TUBAL LIGATION  1976    There were no vitals filed for this visit.  Subjective Assessment - 08/11/18 1539    Subjective  Pt reports she did her exercises last night and it agrivated her back; didn't sleep well at all.  Today, she hasn't done much, so her back  doesn't hurt too bad.      Currently in Pain?  No/denies    Pain Score  0-No pain         [facility] PT Assessment - 08/11/18 0001      Assessment   Medical Diagnosis  Disc Degeneration; Lumbar Region    Referring Provider  Dr. Suella Broad    Onset Date/Surgical Date  --   1 year ago   Next MD Visit  PRN      [facility] Adult PT Treatment/Exercise - 08/11/18 0001      Self-Care   Self-Care  Other Self-Care Comments    Other Self-Care Comments   pt educated on self massage to hips; pt returned demo with cues.       Exercises   Exercises  Lumbar      Lumbar Exercises: Stretches   Passive Hamstring Stretch  Right;Left;2 reps;30 seconds    Passive Hamstring Stretch Limitations  seated Lt hamstring stretch x 10 sec     Piriformis Stretch  Right;Left;30 seconds;3 reps    Piriformis Stretch Limitations  seated Lt stretch x 10 sec       Lumbar Exercises: Aerobic   Nustep  L3: 41min  -PTA present to discuss progress.      Lumbar Exercises: Supine  Pelvic Tilt  5 reps;5 seconds    Pelvic Tilt Limitations  cues on technique and counting    Bridge  10 reps;5 seconds   assistance for counting/reps     Lumbar Exercises: Sidelying   Hip Abduction  Right;Left;10 reps   2 sets on RLE, only tolerated 1 set on LLE     Modalities   Modalities  Moist Heat      Moist Heat Therapy   Number Minutes Moist Heat  10 Minutes    Moist Heat Location  Lumbar Spine   glutes                 PT Long Term Goals - 08/09/18 1212      PT LONG TERM GOAL #1   Title  independent with HEP    Status  New    Target Date  09/20/18      PT LONG TERM GOAL #2   Title  verbalize understanding of posture/body mechanics to decrease risk of reinjury    Status  New    Target Date  09/20/18      PT LONG TERM GOAL #3   Title  demonstrate 4/5 bil hip strength for improved function and mobility    Status  New    Target Date  09/20/18      PT LONG TERM GOAL #4   Title  report ability to perform  housework with pain < 3/10 for improved function    Status  New    Target Date  09/20/18            Plan - 08/11/18 1603    Clinical Impression Statement  Pt reported improved tolerance for HEP exercises than when she performed them last night. she was only able to complete 8-10 reps of hip abdct on LLE. She needed assistance for keeping track of counting and reps during exercise.  she tolerated all exercise well, with minimal increase in pain.      Rehab Potential  Good    PT Frequency  2x / week    PT Duration  6 weeks    PT Treatment/Interventions  ADLs/Self Care Home Management;Electrical Stimulation;Cryotherapy;Moist Heat;Traction;Therapeutic exercise;Therapeutic activities;Functional mobility training;Stair training;Gait training;Balance training;Ultrasound;Neuromuscular re-education;Patient/family education;Manual techniques;Taping;Dry needling    PT Next Visit Plan  review HEP, manual/modalities/DN PRN for pain     PT Home Exercise Plan  Access Code: [email or web address]     Consulted and Agree with Plan of Care  Patient       Patient will benefit from skilled therapeutic intervention in order to improve the following deficits and impairments:  Pain, Increased fascial restricitons, Increased muscle spasms, Decreased mobility, Impaired flexibility, Postural dysfunction, Decreased strength  Visit Diagnosis: Chronic midline low back pain without sciatica  Abnormal posture  Muscle weakness (generalized)     Problem List There are no active problems to display for this patient.  Kerin Perna, PTA 08/11/18 4:17 PM  [facility] [place], [place], [address] Phone: [phone]   Fax:  [phone]  Name: [name] MRN: [id number] Date of Birth: [birth year]

## 2018-08-14 ENCOUNTER — Encounter: Payer: Self-pay | Admitting: Physical Therapy

## 2018-08-16 ENCOUNTER — Encounter: Payer: Self-pay | Admitting: Physical Therapy

## 2018-08-16 ENCOUNTER — Ambulatory Visit: Payer: Medicare HMO | Admitting: Physical Therapy

## 2018-08-16 DIAGNOSIS — M6281 Muscle weakness (generalized): Secondary | ICD-10-CM

## 2018-08-16 DIAGNOSIS — G8929 Other chronic pain: Secondary | ICD-10-CM

## 2018-08-16 DIAGNOSIS — R293 Abnormal posture: Secondary | ICD-10-CM | POA: Diagnosis not present

## 2018-08-16 DIAGNOSIS — M545 Low back pain, unspecified: Secondary | ICD-10-CM

## 2018-08-16 NOTE — Therapy (Signed)
[place], [place], [address] Phone: [phone]   Fax:  [phone]  Physical Therapy Treatment  Patient Details  Name: [name] MRN: [id number] Date of Birth: [birth year] Referring Provider: Dr. Suella Broad   Encounter Date: 08/16/2018  PT End of Session - 08/16/18 1515    Visit Number  3    Number of Visits  12    Date for PT Re-Evaluation  09/20/18    Authorization Type  [employer]    PT Start Time  1430    PT Stop Time  1525    PT Time Calculation (min)  55 min    Activity Tolerance  Patient tolerated treatment well    Behavior During Therapy  [facility] for tasks assessed/performed       Past Medical History:  Diagnosis Date  . Anxiety   . Arthritis   . Asthma   . Depression   . GERD (gastroesophageal reflux disease)   . Headache(784.0)   . Hypertension   . Hypothyroidism   . PONV (postoperative nausea and vomiting)   . Wears glasses     Past Surgical History:  Procedure Laterality Date  . ABDOMINAL HYSTERECTOMY  1981  . APPENDECTOMY     age 3  . CHOLECYSTECTOMY  1/10   lap choli  . COLONOSCOPY    . CYSTOCELE REPAIR  8/10   ant/post-rectocele/cystocele repair  . OVARY SURGERY  1995  . SHOULDER ARTHROSCOPY WITH ROTATOR CUFF REPAIR AND SUBACROMIAL DECOMPRESSION  12/12/2012   Procedure: SHOULDER ARTHROSCOPY WITH ROTATOR CUFF REPAIR AND SUBACROMIAL DECOMPRESSION;  Surgeon: Garald Balding, MD;  Location: [place];  Service: Orthopedics;  Laterality: Right;  Right Shoulder Mini Open Rotator Cuff Repair and Subacromial Decompression, possible Patch.  . TONSILLECTOMY     age 73  . TUBAL LIGATION  1976    There were no vitals filed for this visit.  Subjective Assessment - 08/16/18 1435    Subjective  back was doing pretty well last week, but has been worse this week.  went out of town last week and thinks riding in the car caused pain.    Patient Stated Goals   improve pain    Currently in Pain?  Yes    Pain Score  5     Pain Location  Back    Pain Orientation  Mid;Lower    Pain Descriptors / Indicators  Sharp    Pain Type  Chronic pain    Pain Onset  More than a month ago    Pain Frequency  Intermittent    Aggravating Factors   forward bending, lying down (takes a while to get comfortable)    Pain Relieving Factors  sit down, heat                       [facility] Adult PT Treatment/Exercise - 08/16/18 1438      Self-Care   Other Self-Care Comments   educated on benefits to stopping and stretching/walking when driving longer distances      Exercises   Exercises  Lumbar      Lumbar Exercises: Stretches   Passive Hamstring Stretch  Right;Left;2 reps;30 seconds    Passive Hamstring Stretch Limitations  seated    Single Knee to Chest Stretch  Right;Left;2 reps;30 seconds    Prone on Elbows Stretch  2 reps;60 seconds   continuous   Piriformis Stretch  Right;Left;30 seconds;3 reps  Piriformis Stretch Limitations  seated      Lumbar Exercises: Aerobic   Nustep  L5 x 5 min      Lumbar Exercises: Seated   Other Seated Lumbar Exercises  pelvic rocking x 10 bil (ant/post tilt)      Lumbar Exercises: Supine   Pelvic Tilt  10 reps;5 seconds    Pelvic Tilt Limitations  cues on technique and counting    Bridge  10 reps;5 seconds      Modalities   Modalities  Moist Heat;Electrical Stimulation      Moist Heat Therapy   Number Minutes Moist Heat  15 Minutes    Moist Heat Location  Lumbar Spine   glutes     Electrical Stimulation   Electrical Stimulation Location  Lt glute    Electrical Stimulation Action  IFC    Electrical Stimulation Parameters  to tolerance x 15 min    Electrical Stimulation Goals  Pain                  PT Long Term Goals - 08/09/18 1212      PT LONG TERM GOAL #1   Title  independent with HEP    Status  New    Target Date  09/20/18      PT LONG TERM GOAL #2   Title  verbalize  understanding of posture/body mechanics to decrease risk of reinjury    Status  New    Target Date  09/20/18      PT LONG TERM GOAL #3   Title  demonstrate 4/5 bil hip strength for improved function and mobility    Status  New    Target Date  09/20/18      PT LONG TERM GOAL #4   Title  report ability to perform housework with pain < 3/10 for improved function    Status  New    Target Date  09/20/18            Plan - 08/16/18 1515    Clinical Impression Statement  Pt arrived today with increased pain due to prolonged sitting while in car this past weekend.  Educated on frequent breaks.  Pt may benefit from DN to help with active trigger points in glutes.  No goals met at this time.    Rehab Potential  Good    PT Frequency  2x / week    PT Duration  6 weeks    PT Treatment/Interventions  ADLs/Self Care Home Management;Electrical Stimulation;Cryotherapy;Moist Heat;Traction;Therapeutic exercise;Therapeutic activities;Functional mobility training;Stair training;Gait training;Balance training;Ultrasound;Neuromuscular re-education;Patient/family education;Manual techniques;Taping;Dry needling    PT Next Visit Plan  review HEP, manual/modalities/DN PRN for pain     PT Home Exercise Plan  Access Code: [email or web address]     Consulted and Agree with Plan of Care  Patient       Patient will benefit from skilled therapeutic intervention in order to improve the following deficits and impairments:  Pain, Increased fascial restricitons, Increased muscle spasms, Decreased mobility, Impaired flexibility, Postural dysfunction, Decreased strength  Visit Diagnosis: Chronic midline low back pain without sciatica  Abnormal posture  Muscle weakness (generalized)     Problem List There are no active problems to display for this patient.     Laureen Abrahams, PT, DPT 08/16/18 3:17 PM     [facility] [place],  [place], [address] Phone: [phone]   Fax:  [phone]  Name: [name] MRN: [id number] Date of Birth:  1946-10-23

## 2018-08-18 ENCOUNTER — Ambulatory Visit: Payer: Medicare HMO | Admitting: Physical Therapy

## 2018-08-18 DIAGNOSIS — M545 Low back pain, unspecified: Secondary | ICD-10-CM

## 2018-08-18 DIAGNOSIS — R293 Abnormal posture: Secondary | ICD-10-CM

## 2018-08-18 DIAGNOSIS — G8929 Other chronic pain: Secondary | ICD-10-CM | POA: Diagnosis not present

## 2018-08-18 DIAGNOSIS — M6281 Muscle weakness (generalized): Secondary | ICD-10-CM

## 2018-08-18 NOTE — Therapy (Signed)
[place], [place], [address] Phone: [phone]   Fax:  [phone]  Physical Therapy Treatment  Patient Details  Name: [name] MRN: [id number] Date of Birth: 1946/09/10 Referring Provider: Dr. Suella Broad   Encounter Date: 08/18/2018  PT End of Session - 08/18/18 1334    Visit Number  4    Number of Visits  12    Date for PT Re-Evaluation  09/20/18    Authorization Type  [employer]    PT Start Time  1333    PT Stop Time  1417    PT Time Calculation (min)  44 min    Activity Tolerance  Patient tolerated treatment well    Behavior During Therapy  [facility] for tasks assessed/performed       Past Medical History:  Diagnosis Date  . Anxiety   . Arthritis   . Asthma   . Depression   . GERD (gastroesophageal reflux disease)   . Headache(784.0)   . Hypertension   . Hypothyroidism   . PONV (postoperative nausea and vomiting)   . Wears glasses     Past Surgical History:  Procedure Laterality Date  . ABDOMINAL HYSTERECTOMY  1981  . APPENDECTOMY     age 49  . CHOLECYSTECTOMY  1/10   lap choli  . COLONOSCOPY    . CYSTOCELE REPAIR  8/10   ant/post-rectocele/cystocele repair  . OVARY SURGERY  1995  . SHOULDER ARTHROSCOPY WITH ROTATOR CUFF REPAIR AND SUBACROMIAL DECOMPRESSION  12/12/2012   Procedure: SHOULDER ARTHROSCOPY WITH ROTATOR CUFF REPAIR AND SUBACROMIAL DECOMPRESSION;  Surgeon: Garald Balding, MD;  Location: [place];  Service: Orthopedics;  Laterality: Right;  Right Shoulder Mini Open Rotator Cuff Repair and Subacromial Decompression, possible Patch.  . TONSILLECTOMY     age 4  . TUBAL LIGATION  1976    There were no vitals filed for this visit.  Subjective Assessment - 08/18/18 1334    Subjective  Pt reports she was doing some deep cleaning in house yesterday, "but it had to be done!".   She thinks the stretches are helping some.  She is unsure about the DN.      Patient Stated Goals  improve pain    Currently in Pain?  Yes    Pain Score  8     Pain Location  Back    Pain Orientation  Right;Left;Lower    Pain Radiating Towards  into bilat hips     Aggravating Factors   forward bending    Pain Relieving Factors  sit down, rest, heat         [facility] PT Assessment - 08/18/18 0001      Assessment   Medical Diagnosis  Disc Degeneration; Lumbar Region    Referring Provider  Dr. Suella Broad    Next MD Visit  PRN      Palpation   SI assessment   Lt ASIS higher than Rt. Rt sacral torsion.        [place] Adult PT Treatment/Exercise - 08/18/18 0001      Exercises   Exercises  Lumbar      Lumbar Exercises: Stretches   Passive Hamstring Stretch  Right;Left;2 reps;30 seconds   supine with strap   Single Knee to Chest Stretch  Right;1 rep    Single Knee to Chest Stretch Limitations  Painful in Lt SI joint; stopped     Lower Trunk Rotation  4 reps;10 seconds  arms in T, range to tolerance   Prone on Elbows Stretch  60 seconds;2 reps    Piriformis Stretch  Right;Left;30 seconds;3 reps    Piriformis Stretch Limitations  seated not tolerated; switched to supine with improved tolerance      Lumbar Exercises: Supine   Pelvic Tilt  2 reps;5 seconds    Bridge  10 reps;5 seconds      Lumbar Exercises: Prone   Opposite Arm/Leg Raise Limitations  2 reps each side; painful at Lt SI joint, stopped.       Moist Heat Therapy   Number Minutes Moist Heat  15 Minutes    Moist Heat Location  Lumbar Spine   glutes     Electrical Stimulation   Electrical Stimulation Location  Lt glute    Electrical Stimulation Action  IFC    Electrical Stimulation Parameters  to tolerance x 15 min     Electrical Stimulation Goals  Pain      Manual Therapy   Manual Therapy  Muscle Energy Technique;Soft tissue mobilization    Soft tissue mobilization  gentle STM to Rt/Lt glutes.     Muscle Energy Technique  MET to correct Rt sacral torsion                    PT Long Term Goals - 08/09/18 1212      PT LONG TERM GOAL #1   Title  independent with HEP    Status  New    Target Date  09/20/18      PT LONG TERM GOAL #2   Title  verbalize understanding of posture/body mechanics to decrease risk of reinjury    Status  New    Target Date  09/20/18      PT LONG TERM GOAL #3   Title  demonstrate 4/5 bil hip strength for improved function and mobility    Status  New    Target Date  09/20/18      PT LONG TERM GOAL #4   Title  report ability to perform housework with pain < 3/10 for improved function    Status  New    Target Date  09/20/18            Plan - 08/18/18 1343    Clinical Impression Statement  Pt has some pelvis asymmetries; point tender in multiple areas.  Limited tolerance for exercises.  Pt will benefit from manual therapy/ DN to hips.  Pt would benefit from continued posture/ body mechanics education.      Rehab Potential  Good    PT Frequency  2x / week    PT Duration  6 weeks    PT Treatment/Interventions  ADLs/Self Care Home Management;Electrical Stimulation;Cryotherapy;Moist Heat;Traction;Therapeutic exercise;Therapeutic activities;Functional mobility training;Stair training;Gait training;Balance training;Ultrasound;Neuromuscular re-education;Patient/family education;Manual techniques;Taping;Dry needling    PT Next Visit Plan  manual therapy to Rt/Lt hip.  DN?    Consulted and Agree with Plan of Care  Patient       Patient will benefit from skilled therapeutic intervention in order to improve the following deficits and impairments:  Pain, Increased fascial restricitons, Increased muscle spasms, Decreased mobility, Impaired flexibility, Postural dysfunction, Decreased strength  Visit Diagnosis: Chronic midline low back pain without sciatica  Abnormal posture  Muscle weakness (generalized)     Problem List There are no active problems to display for this patient.  [name],  PTA 08/18/18 4:36 PM  [facility] [place],  [place], [address] Phone: [phone]   Fax:  [phone]  Name: [name] MRN: [id number] Date of Birth: 10-Jun-1946

## 2018-08-23 ENCOUNTER — Ambulatory Visit: Payer: Medicare HMO | Admitting: Physical Therapy

## 2018-08-23 ENCOUNTER — Encounter: Payer: Self-pay | Admitting: Physical Therapy

## 2018-08-23 DIAGNOSIS — M545 Low back pain: Secondary | ICD-10-CM

## 2018-08-23 DIAGNOSIS — R293 Abnormal posture: Secondary | ICD-10-CM

## 2018-08-23 DIAGNOSIS — M6281 Muscle weakness (generalized): Secondary | ICD-10-CM | POA: Diagnosis not present

## 2018-08-23 DIAGNOSIS — G8929 Other chronic pain: Secondary | ICD-10-CM

## 2018-08-23 NOTE — Patient Instructions (Signed)
  Abdominal Bracing With Pelvic Floor (Hook-Lying)   With neutral spine, tighten pelvic floor and abdominals. Hold 10 seconds. Repeat __10_ times. Do _several__ times a day. Can be performed in sitting, standing. .  Knee to Chest: Transverse Plane Stability   Bring one knee up, then return. Be sure pelvis does not roll side to side. Keep pelvis still. Lift knee __10_ times each leg. Restabilize pelvis. Repeat with other leg. Do _1-2__ sets, _1-2__ times per day.  Hip External Rotation With Pillow: Transverse Plane Stability   KEEP BOTH KNEES BENT.  Slowly roll bent knee out. Be sure pelvis does not rotate. Do _10__ times. Restabilize pelvis. Repeat with other leg. Do _1-2__ sets, _1__ times per day.    [facility] [place], [place] [address]  [phone] (office) [phone] (fax)

## 2018-08-23 NOTE — Therapy (Signed)
[place], [place], [address] Phone: [phone]   Fax:  [phone]  Physical Therapy Treatment  Patient Details  Name: [name] MRN: [id number] Date of Birth: 11-02-1946 Referring Provider: Dr. Suella Broad   Encounter Date: 08/23/2018  PT End of Session - 08/23/18 1437    Visit Number  5    Number of Visits  12    Date for PT Re-Evaluation  09/20/18    Authorization Type  [employer]    PT Start Time  6962    PT Stop Time  1529    PT Time Calculation (min)  56 min    Activity Tolerance  Patient tolerated treatment well;No increased pain    Behavior During Therapy  WFL for tasks assessed/performed       Past Medical History:  Diagnosis Date  . Anxiety   . Arthritis   . Asthma   . Depression   . GERD (gastroesophageal reflux disease)   . Headache(784.0)   . Hypertension   . Hypothyroidism   . PONV (postoperative nausea and vomiting)   . Wears glasses     Past Surgical History:  Procedure Laterality Date  . ABDOMINAL HYSTERECTOMY  1981  . APPENDECTOMY     age 27  . CHOLECYSTECTOMY  1/10   lap choli  . COLONOSCOPY    . CYSTOCELE REPAIR  8/10   ant/post-rectocele/cystocele repair  . OVARY SURGERY  1995  . SHOULDER ARTHROSCOPY WITH ROTATOR CUFF REPAIR AND SUBACROMIAL DECOMPRESSION  12/12/2012   Procedure: SHOULDER ARTHROSCOPY WITH ROTATOR CUFF REPAIR AND SUBACROMIAL DECOMPRESSION;  Surgeon: Garald Balding, MD;  Location: [place];  Service: Orthopedics;  Laterality: Right;  Right Shoulder Mini Open Rotator Cuff Repair and Subacromial Decompression, possible Patch.  . TONSILLECTOMY     age 50  . TUBAL LIGATION  1976    There were no vitals filed for this visit.  Subjective Assessment - 08/23/18 1438    Subjective  "I'm actually feeling better."   Pt reports she got over the deep cleaning.     Currently in Pain?  Yes    Pain Score  3     Pain Location   Back    Pain Orientation  Right;Left;Lower    Pain Descriptors / Indicators  Sharp    Aggravating Factors   forward bending    Pain Relieving Factors  sit down, rest, heat          [facility] PT Assessment - 08/23/18 0001      Assessment   Medical Diagnosis  Disc Degeneration; Lumbar Region    Referring Provider  Dr. Suella Broad    Next MD Visit  PRN      Palpation   SI assessment   Lt ASIS higher than Rt. Lt sacral torsion.        [place] Adult PT Treatment/Exercise - 08/23/18 0001      Exercises   Exercises  Lumbar      Lumbar Exercises: Stretches   Passive Hamstring Stretch  Right;Left;2 reps;30 seconds   supine with strap   Piriformis Stretch  Right;Left;30 seconds;3 reps   supine     Lumbar Exercises: Aerobic   Elliptical  2.0 mph x 4 min    PTA present to monitor     Lumbar Exercises: Supine   Ab Set  5 reps;5 seconds    Clam  10 reps   with ab set   Bent Knee Raise  10 reps   with ab set   Bridge  10 reps;5 seconds      Lumbar Exercises: Prone   Opposite Arm/Leg Raise  Right arm/Left leg;Left arm/Right leg;5 reps   improved tolerance from last session     Moist Heat Therapy   Number Minutes Moist Heat  15 Minutes    Moist Heat Location  Lumbar Spine   glutes     Electrical Stimulation   Electrical Stimulation Location  Lt glute    Electrical Stimulation Action  IFC    Electrical Stimulation Parameters  to tolerance    Electrical Stimulation Goals  Pain      Manual Therapy   Soft tissue mobilization  gentle STM to Lt glutes.     Muscle Energy Technique  MET to correct Lt post rotated ilium              PT Education - 08/23/18 1514    Education Details  HEP    Person(s) Educated  Patient    Methods  Explanation;Demonstration    Comprehension  Verbalized understanding;Returned demonstration          PT Long Term Goals - 08/09/18 1212      PT LONG TERM GOAL #1   Title  independent with HEP    Status  New    Target Date  09/20/18       PT LONG TERM GOAL #2   Title  verbalize understanding of posture/body mechanics to decrease risk of reinjury    Status  New    Target Date  09/20/18      PT LONG TERM GOAL #3   Title  demonstrate 4/5 bil hip strength for improved function and mobility    Status  New    Target Date  09/20/18      PT LONG TERM GOAL #4   Title  report ability to perform housework with pain < 3/10 for improved function    Status  New    Target Date  09/20/18            Plan - 08/23/18 1516    Clinical Impression Statement  Pt continues to have some pelvis asymmetries (posteriorly rotated Lt ilium); improved with MET correction.  Pt tolerated all exercises well, except prone opp arm/leg; pt report increased discomfort in shoulders and low back.  Pt reported reduction in pain in Lt SI area/LB at end of session.  Progressing towards goals.     Rehab Potential  Good    PT Frequency  2x / week    PT Duration  6 weeks    PT Treatment/Interventions  ADLs/Self Care Home Management;Electrical Stimulation;Cryotherapy;Moist Heat;Traction;Therapeutic exercise;Therapeutic activities;Functional mobility training;Stair training;Gait training;Balance training;Ultrasound;Neuromuscular re-education;Patient/family education;Manual techniques;Taping;Dry needling    PT Next Visit Plan  manual therapy to Rt/Lt hip. assess hip strength.     Consulted and Agree with Plan of Care  Patient       Patient will benefit from skilled therapeutic intervention in order to improve the following deficits and impairments:  Pain, Increased fascial restricitons, Increased muscle spasms, Decreased mobility, Impaired flexibility, Postural dysfunction, Decreased strength  Visit Diagnosis: Chronic midline low back pain without sciatica  Abnormal posture  Muscle weakness (generalized)     Problem List There are no active problems to display for this patient.  Kerin Perna, PTA 08/23/18 3:56 PM  [facility] [place], [place], [address] Phone: [phone]   Fax:  [phone]  Name: [name] MRN: [id number] Date of Birth: [birth year]

## 2018-08-24 DIAGNOSIS — H26491 Other secondary cataract, right eye: Secondary | ICD-10-CM | POA: Diagnosis not present

## 2018-08-25 ENCOUNTER — Encounter: Payer: Self-pay | Admitting: Physical Therapy

## 2018-08-25 ENCOUNTER — Ambulatory Visit: Payer: Medicare HMO | Admitting: Physical Therapy

## 2018-08-25 DIAGNOSIS — R293 Abnormal posture: Secondary | ICD-10-CM | POA: Diagnosis not present

## 2018-08-25 DIAGNOSIS — G8929 Other chronic pain: Secondary | ICD-10-CM

## 2018-08-25 DIAGNOSIS — M6281 Muscle weakness (generalized): Secondary | ICD-10-CM | POA: Diagnosis not present

## 2018-08-25 DIAGNOSIS — M545 Low back pain: Secondary | ICD-10-CM

## 2018-08-25 NOTE — Therapy (Signed)
[place], [place], [address] Phone: [phone]   Fax:  [phone]  Physical Therapy Treatment  Patient Details  Name: [name] MRN: [id number] Date of Birth: 1946/08/08 Referring Provider (PT): Dr. Suella Broad   Encounter Date: 08/25/2018  PT End of Session - 08/25/18 1241    Visit Number  6    Number of Visits  12    Date for PT Re-Evaluation  09/20/18    Authorization Type  [employer]    PT Start Time  6767    PT Stop Time  1257    PT Time Calculation (min)  58 min    Activity Tolerance  Patient tolerated treatment well;No increased pain    Behavior During Therapy  WFL for tasks assessed/performed       Past Medical History:  Diagnosis Date  . Anxiety   . Arthritis   . Asthma   . Depression   . GERD (gastroesophageal reflux disease)   . Headache(784.0)   . Hypertension   . Hypothyroidism   . PONV (postoperative nausea and vomiting)   . Wears glasses     Past Surgical History:  Procedure Laterality Date  . ABDOMINAL HYSTERECTOMY  1981  . APPENDECTOMY     age 34  . CHOLECYSTECTOMY  1/10   lap choli  . COLONOSCOPY    . CYSTOCELE REPAIR  8/10   ant/post-rectocele/cystocele repair  . OVARY SURGERY  1995  . SHOULDER ARTHROSCOPY WITH ROTATOR CUFF REPAIR AND SUBACROMIAL DECOMPRESSION  12/12/2012   Procedure: SHOULDER ARTHROSCOPY WITH ROTATOR CUFF REPAIR AND SUBACROMIAL DECOMPRESSION;  Surgeon: Garald Balding, MD;  Location: [facility];  Service: Orthopedics;  Laterality: Right;  Right Shoulder Mini Open Rotator Cuff Repair and Subacromial Decompression, possible Patch.  . TONSILLECTOMY     age 54  . TUBAL LIGATION  1976    There were no vitals filed for this visit.  Subjective Assessment - 08/25/18 1200    Subjective  doing well, no pain since wednesday.  hasn't tried any housework at this time    Patient Stated Goals  improve pain    Pain Score  0-No  pain         [facility] PT Assessment - 08/25/18 1223      Assessment   Medical Diagnosis  Disc Degeneration; Lumbar Region    Referring Provider (PT)  Dr. Suella Broad      Strength   Right Hip Flexion  3+/5    Right Hip Extension  4/5    Right Hip ABduction  4/5    Left Hip Flexion  3+/5    Left Hip Extension  3+/5    Left Hip ABduction  4/5                   [facility] Adult PT Treatment/Exercise - 08/25/18 1203      Self-Care   Other Self-Care Comments   educated on proper lifting techniques as well as posture/body mechanics for household activities      Exercises   Exercises  Lumbar      Lumbar Exercises: Aerobic   Nustep  L5 x 5 min      Lumbar Exercises: Supine   Ab Set  10 reps;5 seconds    Clam  10 reps   with ab set   Bent Knee Raise  10 reps   with ab set   Bridge  10 reps;5 seconds  Lumbar Exercises: Prone   Straight Leg Raise  10 reps   Rt side     Moist Heat Therapy   Number Minutes Moist Heat  15 Minutes    Moist Heat Location  Lumbar Spine   glutes     Electrical Stimulation   Electrical Stimulation Location  Lt glute    Electrical Stimulation Action  IFC    Electrical Stimulation Parameters  to tolerance x 15 min    Electrical Stimulation Goals  Pain      Manual Therapy   Muscle Energy Technique  MET to correct Lt post rotated ilium                   PT Long Term Goals - 08/25/18 1243      PT LONG TERM GOAL #1   Title  independent with HEP    Baseline  9/27: still needs cues    Status  On-going    Target Date  09/20/18      PT LONG TERM GOAL #2   Title  verbalize understanding of posture/body mechanics to decrease risk of reinjury    Baseline  9/27:initiated education today    Status  On-going      PT LONG TERM GOAL #3   Title  demonstrate 4/5 bil hip strength for improved function and mobility    Baseline  9/27: improved but not to goal in some areas    Status  Partially Met      PT LONG TERM GOAL #4    Title  report ability to perform housework with pain < 3/10 for improved function    Baseline  9/27: pt hasn't performed yet    Status  On-going            Plan - 08/25/18 1244    Clinical Impression Statement  Pt reports no pain since Wednesday, and today no significant pelvic asymmetries noted but repeated MET today at pt's request.  Pt progressing towards all goals, and instructed to attempt light housework this weekend.     Rehab Potential  Good    PT Frequency  2x / week    PT Duration  6 weeks    PT Treatment/Interventions  ADLs/Self Care Home Management;Electrical Stimulation;Cryotherapy;Moist Heat;Traction;Therapeutic exercise;Therapeutic activities;Functional mobility training;Stair training;Gait training;Balance training;Ultrasound;Neuromuscular re-education;Patient/family education;Manual techniques;Taping;Dry needling    PT Next Visit Plan  manual therapy to Rt/Lt hip. assess hip strength.     PT Home Exercise Plan  Access Code: [email or web address]     Consulted and Agree with Plan of Care  Patient       Patient will benefit from skilled therapeutic intervention in order to improve the following deficits and impairments:  Pain, Increased fascial restricitons, Increased muscle spasms, Decreased mobility, Impaired flexibility, Postural dysfunction, Decreased strength  Visit Diagnosis: Chronic midline low back pain without sciatica  Abnormal posture  Muscle weakness (generalized)     Problem List There are no active problems to display for this patient.     Laureen Abrahams, PT, DPT 08/25/18 12:58 PM    [facility] [place], [place], [address] Phone: [phone]   Fax:  [phone]  Name: [name] MRN: [id number] Date of Birth: [birth year]

## 2018-08-25 NOTE — Patient Instructions (Signed)
Access Code: [email or web address]  URL: [email or web address]  Date: 08/25/2018  Prepared by: Faustino Congress   Exercises  Sidelying Hip Abduction - 8-10 reps - 1-2 sets - 1x daily - 7x weekly  Supine Piriformis Stretch with Foot on Ground - 3 reps - 1 sets - 30 sec hold - 2x daily - 7x weekly  Supine Posterior Pelvic Tilt - 5 reps - 1 sets - 5 sec hold - 2x daily - 7x weekly  Supine Bridge - 10 reps - 1 sets - 5 sec hold - 2x daily - 7x weekly  Hooklying Hamstring Stretch with Strap - 3 reps - 1 sets - 30 seconds hold - 2x daily - 7x weekly  Seated Hamstring Stretch - 3 reps - 1 sets - 30 hold - 1x daily - 7x weekly  Seated Piriformis Stretch - 3 reps - 1 sets - 30 hold - 1x daily - 7x weekly  Patient Education  Trigger Point Dry Needling  Lifting Techniques  Household Activities

## 2018-08-29 ENCOUNTER — Ambulatory Visit: Payer: Medicare HMO | Admitting: Physical Therapy

## 2018-08-29 DIAGNOSIS — M6281 Muscle weakness (generalized): Secondary | ICD-10-CM

## 2018-08-29 DIAGNOSIS — R293 Abnormal posture: Secondary | ICD-10-CM

## 2018-08-29 DIAGNOSIS — M545 Low back pain, unspecified: Secondary | ICD-10-CM

## 2018-08-29 DIAGNOSIS — G8929 Other chronic pain: Secondary | ICD-10-CM

## 2018-08-29 NOTE — Therapy (Signed)
[place], [place], [address] Phone: [phone]   Fax:  [phone]  Physical Therapy Treatment  Patient Details  Name: [name] MRN: [id number] Date of Birth: 03-20-1946 Referring Provider (PT): Dr. Suella Broad   Encounter Date: 08/29/2018  PT End of Session - 08/29/18 1140    Visit Number  7    Number of Visits  12    Date for PT Re-Evaluation  09/20/18    Authorization Type  [employer]    PT Start Time  1100    PT Stop Time  1150    PT Time Calculation (min)  50 min    Activity Tolerance  Patient tolerated treatment well    Behavior During Therapy  [facility] for tasks assessed/performed       Past Medical History:  Diagnosis Date  . Anxiety   . Arthritis   . Asthma   . Depression   . GERD (gastroesophageal reflux disease)   . Headache(784.0)   . Hypertension   . Hypothyroidism   . PONV (postoperative nausea and vomiting)   . Wears glasses     Past Surgical History:  Procedure Laterality Date  . ABDOMINAL HYSTERECTOMY  1981  . APPENDECTOMY     age 28  . CHOLECYSTECTOMY  1/10   lap choli  . COLONOSCOPY    . CYSTOCELE REPAIR  8/10   ant/post-rectocele/cystocele repair  . OVARY SURGERY  1995  . SHOULDER ARTHROSCOPY WITH ROTATOR CUFF REPAIR AND SUBACROMIAL DECOMPRESSION  12/12/2012   Procedure: SHOULDER ARTHROSCOPY WITH ROTATOR CUFF REPAIR AND SUBACROMIAL DECOMPRESSION;  Surgeon: Garald Balding, MD;  Location: [place];  Service: Orthopedics;  Laterality: Right;  Right Shoulder Mini Open Rotator Cuff Repair and Subacromial Decompression, possible Patch.  . TONSILLECTOMY     age 55  . TUBAL LIGATION  1976    There were no vitals filed for this visit.  Subjective Assessment - 08/29/18 1101    Subjective  "It is much better. It seems like overnight it just got so much better."  Pt reports she did some cleaning, and was sore afterward (5/10 pain).. but not like  it did last week.   She has brought her TENS in and would like to know how to use it.     Currently in Pain?  No/denies    Pain Score  0-No pain    Aggravating Factors   forward bending     Pain Relieving Factors  sit down, rest, heat         [facility] PT Assessment - 08/29/18 0001      Palpation   SI assessment   Lt ASIS higher than Rt.   Rt sacral torsion       [facility] Adult PT Treatment/Exercise - 08/29/18 0001      Self-Care   Other Self-Care Comments   pt educated on safety, parameters, and application of TENS unit.  Pt verbalized understanding.       Lumbar Exercises: Stretches   Passive Hamstring Stretch  Right;Left;2 reps;30 seconds   supine with strap   Piriformis Stretch  Right;Left;30 seconds;3 reps   supine     Lumbar Exercises: Aerobic   Nustep  L4: 24mn (arms/legs)      Lumbar Exercises: Supine   Bridge  10 reps;5 seconds   with ball squeeze     Lumbar Exercises: Prone   Opposite Arm/Leg Raise  Right arm/Left leg;Left arm/Right leg;5 reps   mild  pain with Lt hip ext     Moist Heat Therapy   Number Minutes Moist Heat  15 Minutes    Moist Heat Location  Lumbar Spine   glutes     Electrical Stimulation   Electrical Stimulation Location  Lt glute    Electrical Stimulation Action  TENS     Electrical Stimulation Parameters  to tolerance, 15 min     Electrical Stimulation Goals  Pain      Manual Therapy   Muscle Energy Technique  MET to correct Rt sacral torsion in prone; MET to correct Lt post rotated ilium in prone        PT Long Term Goals - 08/25/18 1243      PT LONG TERM GOAL #1   Title  independent with HEP    Baseline  9/27: still needs cues    Status  On-going    Target Date  09/20/18      PT LONG TERM GOAL #2   Title  verbalize understanding of posture/body mechanics to decrease risk of reinjury    Baseline  9/27:initiated education today    Status  On-going      PT LONG TERM GOAL #3   Title  demonstrate 4/5 bil hip strength for improved  function and mobility    Baseline  9/27: improved but not to goal in some areas    Status  Partially Met      PT LONG TERM GOAL #4   Title  report ability to perform housework with pain < 3/10 for improved function    Baseline  9/27: pt hasn't performed yet    Status  On-going            Plan - 08/29/18 1141    Clinical Impression Statement  Pt's pelvic alignment similar to session before last; improved with MET correction for posteriorly rotated Lt ilium.  Encouraged pt to complete exercises in session and at home within tolerance so as not to irritate Lt SI joint.  Pt verbalized understanding of application of TENS unit after education.  Pt progressing towards goals.     Rehab Potential  Good    PT Frequency  2x / week    PT Duration  6 weeks    PT Treatment/Interventions  ADLs/Self Care Home Management;Electrical Stimulation;Cryotherapy;Moist Heat;Traction;Therapeutic exercise;Therapeutic activities;Functional mobility training;Stair training;Gait training;Balance training;Ultrasound;Neuromuscular re-education;Patient/family education;Manual techniques;Taping;Dry needling    PT Next Visit Plan  manual therapy to Rt/Lt hip. assess hip strength.     Consulted and Agree with Plan of Care  Patient       Patient will benefit from skilled therapeutic intervention in order to improve the following deficits and impairments:  Pain, Increased fascial restricitons, Increased muscle spasms, Decreased mobility, Impaired flexibility, Postural dysfunction, Decreased strength  Visit Diagnosis: Chronic midline low back pain without sciatica  Abnormal posture  Muscle weakness (generalized)     Problem List There are no active problems to display for this patient.  Kerin Perna, PTA 08/29/18 11:45 AM  [facility] [place], [place], [address] Phone: [phone]   Fax:  [phone]  Name: [name] MRN: [id number] Date of Birth: 1945/12/04

## 2018-09-01 ENCOUNTER — Ambulatory Visit: Payer: Medicare HMO | Admitting: Physical Therapy

## 2018-09-01 ENCOUNTER — Encounter: Payer: Self-pay | Admitting: Physical Therapy

## 2018-09-01 DIAGNOSIS — M545 Low back pain, unspecified: Secondary | ICD-10-CM

## 2018-09-01 DIAGNOSIS — M6281 Muscle weakness (generalized): Secondary | ICD-10-CM | POA: Diagnosis not present

## 2018-09-01 DIAGNOSIS — R293 Abnormal posture: Secondary | ICD-10-CM

## 2018-09-01 DIAGNOSIS — G8929 Other chronic pain: Secondary | ICD-10-CM

## 2018-09-01 NOTE — Therapy (Signed)
[place], [place], [address] Phone: [phone]   Fax:  [phone]  Physical Therapy Treatment  Patient Details  Name: [name] MRN: [id number] Date of Birth: Aug 31, 1946 Referring Provider (PT): Dr. Suella Broad   Encounter Date: 09/01/2018  PT End of Session - 09/01/18 1446    Visit Number  8    Number of Visits  12    Date for PT Re-Evaluation  09/20/18    Authorization Type  [employer]    PT Start Time  6629    PT Stop Time  1453    PT Time Calculation (min)  58 min    Activity Tolerance  Patient tolerated treatment well    Behavior During Therapy  [facility] for tasks assessed/performed       Past Medical History:  Diagnosis Date  . Anxiety   . Arthritis   . Asthma   . Depression   . GERD (gastroesophageal reflux disease)   . Headache(784.0)   . Hypertension   . Hypothyroidism   . PONV (postoperative nausea and vomiting)   . Wears glasses     Past Surgical History:  Procedure Laterality Date  . ABDOMINAL HYSTERECTOMY  1981  . APPENDECTOMY     age 61  . CHOLECYSTECTOMY  1/10   lap choli  . COLONOSCOPY    . CYSTOCELE REPAIR  8/10   ant/post-rectocele/cystocele repair  . OVARY SURGERY  1995  . SHOULDER ARTHROSCOPY WITH ROTATOR CUFF REPAIR AND SUBACROMIAL DECOMPRESSION  12/12/2012   Procedure: SHOULDER ARTHROSCOPY WITH ROTATOR CUFF REPAIR AND SUBACROMIAL DECOMPRESSION;  Surgeon: Garald Balding, MD;  Location: [place];  Service: Orthopedics;  Laterality: Right;  Right Shoulder Mini Open Rotator Cuff Repair and Subacromial Decompression, possible Patch.  . TONSILLECTOMY     age 55  . TUBAL LIGATION  1976    There were no vitals filed for this visit.  Subjective Assessment - 09/01/18 1359    Subjective  Pt reports her hip/low back is feeling much better. "I didn't have to use the thing (TENS)".  She has been doing her exercises daily.     Patient Stated Goals   improve pain    Currently in Pain?  No/denies    Pain Score  0-No pain    Pain Orientation  Right;Left;Lower         [facility] PT Assessment - 09/01/18 0001      Assessment   Medical Diagnosis  Disc Degeneration; Lumbar Region    Referring Provider (PT)  Dr. Suella Broad      Palpation   SI assessment   Lt ASIS higher than Rt. Very point tender Lt ASIS and Lt piriformis        [facility] Adult PT Treatment/Exercise - 09/01/18 0001      Exercises   Exercises  Lumbar      Lumbar Exercises: Stretches   Passive Hamstring Stretch  Right;Left;2 reps;30 seconds   supine with strap   Piriformis Stretch  Right;Left;3 reps;20 seconds   supine     Lumbar Exercises: Aerobic   Tread Mill  2.0 mph x 6 min       Lumbar Exercises: Supine   Bridge  10 reps;5 seconds   with ball squeeze     Lumbar Exercises: Sidelying   Clam  Left;10 reps;5 seconds   2 sets   Clam Limitations  reverse clam x 10 reps LLE      Lumbar Exercises: Prone  Straight Leg Raise  5 reps;2 seconds    Straight Leg Raises Limitations  VC to not lift leg as high, some discomfort in Lt SI.       Moist Heat Therapy   Number Minutes Moist Heat  15 Minutes    Moist Heat Location  Lumbar Spine   glutes     Electrical Stimulation   Electrical Stimulation Location  Lt glute    Electrical Stimulation Action  IFC    Electrical Stimulation Parameters  to tolerance x 15 min     Electrical Stimulation Goals  Pain      Manual Therapy   Soft tissue mobilization  STM to Lt glute/ piriformis with active /passive ER/IR  of LE    Muscle Energy Technique  MET to correct Lt post rotated ilium                   PT Long Term Goals - 08/25/18 1243      PT LONG TERM GOAL #1   Title  independent with HEP    Baseline  9/27: still needs cues    Status  On-going    Target Date  09/20/18      PT LONG TERM GOAL #2   Title  verbalize understanding of posture/body mechanics to decrease risk of reinjury    Baseline   9/27:initiated education today    Status  On-going      PT LONG TERM GOAL #3   Title  demonstrate 4/5 bil hip strength for improved function and mobility    Baseline  9/27: improved but not to goal in some areas    Status  Partially Met      PT LONG TERM GOAL #4   Title  report ability to perform housework with pain < 3/10 for improved function    Baseline  9/27: pt hasn't performed yet    Status  On-going            Plan - 09/01/18 1442    Clinical Impression Statement  Pt slightly rotated posteriorly on Lt ilium; improved alignment with MET correciton.  Pt fatigues quickly with Lt hip clams/reverse clams and is point tender in Lt posterior/lateral hip with manual therapy.  Pt will benefit from continued PT intervention to max functional mobility with less pain.      Rehab Potential  Good    PT Frequency  2x / week    PT Duration  6 weeks    PT Treatment/Interventions  ADLs/Self Care Home Management;Electrical Stimulation;Cryotherapy;Moist Heat;Traction;Therapeutic exercise;Therapeutic activities;Functional mobility training;Stair training;Gait training;Balance training;Ultrasound;Neuromuscular re-education;Patient/family education;Manual techniques;Taping;Dry needling    PT Next Visit Plan  manual therapy to Rt/Lt hip. assess hip strength.     Consulted and Agree with Plan of Care  Patient       Patient will benefit from skilled therapeutic intervention in order to improve the following deficits and impairments:  Pain, Increased fascial restricitons, Increased muscle spasms, Decreased mobility, Impaired flexibility, Postural dysfunction, Decreased strength  Visit Diagnosis: Chronic midline low back pain without sciatica  Abnormal posture  Muscle weakness (generalized)     Problem List There are no active problems to display for this patient.  Kerin Perna, PTA 09/01/18 4:43 PM  [place] [address] [place]  [place], [place], [address] Phone: [phone]   Fax:  [phone]  Name: [name] MRN: [id number] Date of Birth: Jul 16, 1946

## 2018-09-04 ENCOUNTER — Encounter: Payer: Medicare HMO | Admitting: Physical Therapy

## 2018-09-07 ENCOUNTER — Encounter: Payer: Self-pay | Admitting: Physical Therapy

## 2018-09-07 ENCOUNTER — Ambulatory Visit (INDEPENDENT_AMBULATORY_CARE_PROVIDER_SITE_OTHER): Payer: Medicare HMO | Admitting: Physical Therapy

## 2018-09-07 DIAGNOSIS — G8929 Other chronic pain: Secondary | ICD-10-CM

## 2018-09-07 DIAGNOSIS — R293 Abnormal posture: Secondary | ICD-10-CM

## 2018-09-07 DIAGNOSIS — M545 Low back pain: Secondary | ICD-10-CM

## 2018-09-07 DIAGNOSIS — M6281 Muscle weakness (generalized): Secondary | ICD-10-CM

## 2018-09-07 NOTE — Therapy (Addendum)
[place], [place], [address] Phone: [phone]   Fax:  [phone]  Physical Therapy Treatment/Discharge  Patient Details  Name: [name] MRN: [id number] Date of Birth: 11-21-1946 Referring Provider (PT): Dr. Suella Broad   Encounter Date: 09/07/2018  PT End of Session - 09/07/18 1437    Visit Number  9    Number of Visits  12    Date for PT Re-Evaluation  09/20/18    Authorization Type  [employer]    PT Start Time  9563    PT Stop Time  1439    PT Time Calculation (min)  42 min    Activity Tolerance  Patient tolerated treatment well    Behavior During Therapy  [facility] for tasks assessed/performed       Past Medical History:  Diagnosis Date  . Anxiety   . Arthritis   . Asthma   . Depression   . GERD (gastroesophageal reflux disease)   . Headache(784.0)   . Hypertension   . Hypothyroidism   . PONV (postoperative nausea and vomiting)   . Wears glasses     Past Surgical History:  Procedure Laterality Date  . ABDOMINAL HYSTERECTOMY  1981  . APPENDECTOMY     age 89  . CHOLECYSTECTOMY  1/10   lap choli  . COLONOSCOPY    . CYSTOCELE REPAIR  8/10   ant/post-rectocele/cystocele repair  . OVARY SURGERY  1995  . SHOULDER ARTHROSCOPY WITH ROTATOR CUFF REPAIR AND SUBACROMIAL DECOMPRESSION  12/12/2012   Procedure: SHOULDER ARTHROSCOPY WITH ROTATOR CUFF REPAIR AND SUBACROMIAL DECOMPRESSION;  Surgeon: Garald Balding, MD;  Location: [place];  Service: Orthopedics;  Laterality: Right;  Right Shoulder Mini Open Rotator Cuff Repair and Subacromial Decompression, possible Patch.  . TONSILLECTOMY     age 8  . TUBAL LIGATION  1976    There were no vitals filed for this visit.  Subjective Assessment - 09/07/18 1355    Subjective  has been getting on the treadmill and doing well; reports "not much" when asked about pain this week.  mopping is only thing that's still  challenging.    Patient Stated Goals  improve pain    Currently in Pain?  No/denies         [facility] PT Assessment - 09/07/18 1402      Assessment   Medical Diagnosis  Disc Degeneration; Lumbar Region    Referring Provider (PT)  Dr. Suella Broad      Observation/Other Assessments   Focus on Therapeutic Outcomes (FOTO)   57 (43% limited)      Strength   Right Hip Flexion  4+/5    Right Hip Extension  4/5    Right Hip ABduction  4/5    Left Hip Flexion  4+/5    Left Hip Extension  4/5    Left Hip ABduction  4/5                   [facility] Adult PT Treatment/Exercise - 09/07/18 1401      Exercises   Exercises  Lumbar      Lumbar Exercises: Stretches   Passive Hamstring Stretch  Right;Left;2 reps;30 seconds    Passive Hamstring Stretch Limitations  seated    Piriformis Stretch  Right;Left;20 seconds;2 reps      Lumbar Exercises: Aerobic   Nustep  L5: 2mn (arms/legs)      Lumbar Exercises: Supine   Ab Set  10 reps;5  seconds    Clam  10 reps   with ab set   Heel Slides  10 reps   with ab set   Bent Knee Raise  10 reps   with ab set            PT Education - 09/07/18 1437    Education Details  HEP    Person(s) Educated  Patient    Methods  Explanation;Demonstration;Handout    Comprehension  Returned demonstration;Verbalized understanding          PT Long Term Goals - 09/07/18 1439      PT LONG TERM GOAL #1   Title  independent with HEP    Status  Achieved      PT LONG TERM GOAL #2   Title  verbalize understanding of posture/body mechanics to decrease risk of reinjury    Status  Achieved      PT LONG TERM GOAL #3   Title  demonstrate 4/5 bil hip strength for improved function and mobility    Status  Achieved      PT LONG TERM GOAL #4   Title  report ability to perform housework with pain < 3/10 for improved function    Status  Achieved            Plan - 09/07/18 1440    Clinical Impression Statement  Pt has met all goals,  reporting no pain x 1 week with the exception of mopping which she is no longer doing.  FOTO score improved greater than predicted.  Agree to hold PT x 30 days and pt to return if needed.    Rehab Potential  Good    PT Frequency  2x / week    PT Duration  6 weeks    PT Treatment/Interventions  ADLs/Self Care Home Management;Electrical Stimulation;Cryotherapy;Moist Heat;Traction;Therapeutic exercise;Therapeutic activities;Functional mobility training;Stair training;Gait training;Balance training;Ultrasound;Neuromuscular re-education;Patient/family education;Manual techniques;Taping;Dry needling    PT Next Visit Plan  hold PT x 30 days; d/c if not return otherwise reassess    PT Home Exercise Plan  Access Code: [email or web address]     Consulted and Agree with Plan of Care  Patient       Patient will benefit from skilled therapeutic intervention in order to improve the following deficits and impairments:  Pain, Increased fascial restricitons, Increased muscle spasms, Decreased mobility, Impaired flexibility, Postural dysfunction, Decreased strength  Visit Diagnosis: Chronic midline low back pain without sciatica  Abnormal posture  Muscle weakness (generalized)     Problem List There are no active problems to display for this patient.     Laureen Abrahams, PT, DPT 09/07/18 2:42 PM     [facility] [place], [place], [address] Phone: [phone]   Fax:  [phone]  Name: [name] MRN: [id number] Date of Birth: 10-21-1946     PHYSICAL THERAPY DISCHARGE SUMMARY  Visits from Start of Care: 9  Current functional level related to goals / functional outcomes: See above   Remaining deficits: See above   Education / Equipment: HEP  Plan: Patient agrees to discharge.  Patient goals were met. Patient is being discharged due to meeting the stated rehab goals.  ?????     Laureen Abrahams, PT,  DPT 10/09/18 2:03 PM  [facility] [place], [place] [address]  [phone] (office) [phone] (fax)

## 2018-09-07 NOTE — Patient Instructions (Signed)
Access Code: [email or web address]  URL: [email or web address]  Date: 09/07/2018  Prepared by: Faustino Congress   Exercises  Sidelying Hip Abduction - 8-10 reps - 1-2 sets - 1x daily - 7x weekly  Supine Piriformis Stretch with Foot on Ground - 3 reps - 1 sets - 30 sec hold - 2x daily - 7x weekly  Supine Posterior Pelvic Tilt - 5 reps - 1 sets - 5 sec hold - 2x daily - 7x weekly  Supine Bridge - 10 reps - 1 sets - 5 sec hold - 2x daily - 7x weekly  Hooklying Hamstring Stretch with Strap - 3 reps - 1 sets - 30 seconds hold - 2x daily - 7x weekly  Seated Hamstring Stretch - 3 reps - 1 sets - 30 hold - 1x daily - 7x weekly  Seated Piriformis Stretch - 3 reps - 1 sets - 30 hold - 1x daily - 7x weekly  Bent Knee Fallouts - 10 reps - 1-2 sets - 2x daily - 7x weekly  Supine March - 10 reps - 1-2 sets - 2x daily - 7x weekly  Supine Heel Slides - 10 reps - 1-2 sets - 2x daily - 7x weekly  Patient Education  Trigger Point Dry Needling  Lifting Techniques  Household Activities

## 2018-11-19 DIAGNOSIS — B029 Zoster without complications: Secondary | ICD-10-CM | POA: Diagnosis not present

## 2018-12-20 DIAGNOSIS — Z6826 Body mass index (BMI) 26.0-26.9, adult: Secondary | ICD-10-CM | POA: Diagnosis not present

## 2018-12-20 DIAGNOSIS — F5101 Primary insomnia: Secondary | ICD-10-CM | POA: Diagnosis not present

## 2018-12-20 DIAGNOSIS — M1990 Unspecified osteoarthritis, unspecified site: Secondary | ICD-10-CM | POA: Diagnosis not present

## 2018-12-20 DIAGNOSIS — Z0000 Encounter for general adult medical examination without abnormal findings: Secondary | ICD-10-CM | POA: Diagnosis not present

## 2018-12-20 DIAGNOSIS — F325 Major depressive disorder, single episode, in full remission: Secondary | ICD-10-CM | POA: Diagnosis not present

## 2018-12-20 DIAGNOSIS — Z23 Encounter for immunization: Secondary | ICD-10-CM | POA: Diagnosis not present

## 2018-12-20 DIAGNOSIS — E039 Hypothyroidism, unspecified: Secondary | ICD-10-CM | POA: Diagnosis not present

## 2018-12-20 DIAGNOSIS — E7800 Pure hypercholesterolemia, unspecified: Secondary | ICD-10-CM | POA: Diagnosis not present

## 2018-12-20 DIAGNOSIS — I10 Essential (primary) hypertension: Secondary | ICD-10-CM | POA: Diagnosis not present

## 2018-12-20 DIAGNOSIS — G43909 Migraine, unspecified, not intractable, without status migrainosus: Secondary | ICD-10-CM | POA: Diagnosis not present

## 2019-01-05 DIAGNOSIS — M150 Primary generalized (osteo)arthritis: Secondary | ICD-10-CM | POA: Diagnosis not present

## 2019-01-05 DIAGNOSIS — M2550 Pain in unspecified joint: Secondary | ICD-10-CM | POA: Diagnosis not present

## 2019-01-05 DIAGNOSIS — J449 Chronic obstructive pulmonary disease, unspecified: Secondary | ICD-10-CM | POA: Diagnosis not present

## 2019-01-05 DIAGNOSIS — M154 Erosive (osteo)arthritis: Secondary | ICD-10-CM | POA: Diagnosis not present

## 2019-01-05 DIAGNOSIS — Z6826 Body mass index (BMI) 26.0-26.9, adult: Secondary | ICD-10-CM | POA: Diagnosis not present

## 2019-01-05 DIAGNOSIS — L409 Psoriasis, unspecified: Secondary | ICD-10-CM | POA: Diagnosis not present

## 2019-01-05 DIAGNOSIS — E663 Overweight: Secondary | ICD-10-CM | POA: Diagnosis not present

## 2019-01-05 DIAGNOSIS — Z79899 Other long term (current) drug therapy: Secondary | ICD-10-CM | POA: Diagnosis not present

## 2019-05-29 ENCOUNTER — Other Ambulatory Visit: Payer: Self-pay

## 2019-05-29 ENCOUNTER — Encounter: Payer: Self-pay | Admitting: Orthopaedic Surgery

## 2019-05-29 ENCOUNTER — Ambulatory Visit (INDEPENDENT_AMBULATORY_CARE_PROVIDER_SITE_OTHER): Payer: Medicare HMO | Admitting: Orthopaedic Surgery

## 2019-05-29 ENCOUNTER — Ambulatory Visit (INDEPENDENT_AMBULATORY_CARE_PROVIDER_SITE_OTHER): Payer: Medicare HMO

## 2019-05-29 VITALS — BP 148/86 | HR 84 | Ht 63.0 in | Wt 147.0 lb

## 2019-05-29 DIAGNOSIS — M25511 Pain in right shoulder: Secondary | ICD-10-CM

## 2019-05-29 MED ORDER — BUPIVACAINE HCL 0.5 % IJ SOLN
2.0000 mL | INTRAMUSCULAR | Status: AC | PRN
  Administered 2019-05-29: 2 mL via INTRA_ARTICULAR

## 2019-05-29 MED ORDER — METHYLPREDNISOLONE ACETATE 40 MG/ML IJ SUSP
80.0000 mg | INTRAMUSCULAR | Status: AC | PRN
  Administered 2019-05-29: 80 mg via INTRA_ARTICULAR

## 2019-05-29 MED ORDER — LIDOCAINE HCL 2 % IJ SOLN
2.0000 mL | INTRAMUSCULAR | Status: AC | PRN
  Administered 2019-05-29: 2 mL

## 2019-05-29 NOTE — Progress Notes (Signed)
Office Visit Note   Patient: [name]           Date of Birth: Feb 15, 1946           MRN: [id number] Visit Date: 05/29/2019              Requested by: Gavin Pound, MD [place],  [place] [address] PCP: Gavin Pound, MD   Assessment & Plan: Visit Diagnoses:  1. Acute pain of right shoulder     Plan: Probable recurrent rotator cuff tear right shoulder.  Index surgery approximately 6 to 7 years ago.  Will inject the subacromial space right shoulder with cortisone and monitor response over the next 3 weeks.  Have urged her to continue with exercises.  I think she has ruptured   the long head of the biceps tendon.  Films are consistent with what appears to be rotator cuff arthropathy Follow-Up Instructions: Return if symptoms worsen or fail to improve.   Orders:  Orders Placed This Encounter  Procedures  . Large Joint Inj: R subacromial bursa  . XR Shoulder Right   No orders of the defined types were placed in this encounter.     Procedures: Large Joint Inj: R subacromial bursa on 05/29/2019 1:41 PM Indications: pain and diagnostic evaluation Details: 25 G 1.5 in needle, anterolateral approach  Arthrogram: No  Medications: 2 mL lidocaine 2 %; 2 mL bupivacaine 0.5 %; 80 mg methylPREDNISolone acetate 40 MG/ML Consent was given by the patient. Immediately prior to procedure a time out was called to verify the correct patient, procedure, equipment, support staff and site/side marked as required. Patient was prepped and draped in the usual sterile fashion.       Clinical Data: No additional findings.   Subjective: Chief Complaint  Patient presents with  . Right Shoulder - Pain  Patient presents today with right shoulder pain X2 months. Her pain initially has been behind her shoulder. But, she was twisting a doorknob 3 days ago and felt something pop on the anterior side of her shoulder. She has limited range of motion due to pain. She is not taking  anything for pain. She has a history of rotator cuff surgery in 2014 with Dr.. She is right hand dominant.  Has a chronic history of multiple joint arthralgias consistent with osteoarthritis did well after her rotator cuff surgery in 2014 up until about 2 months ago when she experienced insidious onset of recurrent pain  HPI  Review of Systems  Constitutional: Negative for fatigue.  HENT: Negative for ear pain.   Eyes: Negative for pain.  Respiratory: Positive for shortness of breath.   Cardiovascular: Negative for leg swelling.  Gastrointestinal: Negative for constipation and diarrhea.  Endocrine: Negative for cold intolerance and heat intolerance.  Genitourinary: Negative for difficulty urinating.  Musculoskeletal: Positive for joint swelling.  Skin: Positive for rash.  Allergic/Immunologic: Negative for food allergies.  Neurological: Positive for weakness.  Hematological: Does not bruise/bleed easily.  Psychiatric/Behavioral: Positive for sleep disturbance.     Objective: Vital Signs: BP (!) 148/86   Pulse 84   Ht 5\' 3"  (1.6 m)   Wt 147 lb (66.7 kg)   BMI 26.04 kg/m   Physical Exam Constitutional:      Appearance: She is well-developed.  Eyes:     Pupils: Pupils are equal, round, and reactive to light.  Pulmonary:     Effort: Pulmonary effort is normal.  Skin:    General: Skin is warm and  dry.  Neurological:     Mental Status: She is alert and oriented to person, place, and time.  Psychiatric:        Behavior: Behavior normal.     Ortho Exam right shoulder with what appears to be ruptured biceps long head tendon.  Biceps muscle is in a distal position.  No ecchymosis.  Positive impingement and empty can testing.  Able to actively place the arm fully overhead.  Some weakness with external rotation.  No pain at the acromioclavicular joint  Specialty Comments:  No specialty comments available.  Imaging: Xr Shoulder Right  Result Date: 05/29/2019 Films of  the right shoulder obtained in several projections.  There is narrowing of the space between the humeral head and the acromium to about 5 mm.  There is some lateral downsloping of the acromion.  Appears to have had a prior resection of the [facility] joint with a good space and no degenerative changes.  There is elevation of the humeral head and the glenoid.  No ectopic calcification or acute change    PMFS History: There are no active problems to display for this patient.  Past Medical History:  Diagnosis Date  . Anxiety   . Arthritis   . Asthma   . Depression   . GERD (gastroesophageal reflux disease)   . Headache(784.0)   . Hypertension   . Hypothyroidism   . PONV (postoperative nausea and vomiting)   . Wears glasses     History reviewed. No pertinent family history.  Past Surgical History:  Procedure Laterality Date  . ABDOMINAL HYSTERECTOMY  1981  . APPENDECTOMY     age 63  . CHOLECYSTECTOMY  1/10   lap choli  . COLONOSCOPY    . CYSTOCELE REPAIR  8/10   ant/post-rectocele/cystocele repair  . OVARY SURGERY  1995  . SHOULDER ARTHROSCOPY WITH ROTATOR CUFF REPAIR AND SUBACROMIAL DECOMPRESSION  12/12/2012   Procedure: SHOULDER ARTHROSCOPY WITH ROTATOR CUFF REPAIR AND SUBACROMIAL DECOMPRESSION;  Surgeon: Garald Balding, MD;  Location: [place];  Service: Orthopedics;  Laterality: Right;  Right Shoulder Mini Open Rotator Cuff Repair and Subacromial Decompression, possible Patch.  . TONSILLECTOMY     age 51  . TUBAL LIGATION  1976   Social History   Occupational History  . Not on file  Tobacco Use  . Smoking status: Former Smoker    Quit date: 12/07/1988    Years since quitting: 30.4  Substance and Sexual Activity  . Alcohol use: Yes    Comment: occ  . Drug use: Not on file  . Sexual activity: Not on file

## 2019-06-14 DIAGNOSIS — J449 Chronic obstructive pulmonary disease, unspecified: Secondary | ICD-10-CM | POA: Diagnosis not present

## 2019-06-14 DIAGNOSIS — J4540 Moderate persistent asthma, uncomplicated: Secondary | ICD-10-CM | POA: Diagnosis not present

## 2019-06-14 DIAGNOSIS — F329 Major depressive disorder, single episode, unspecified: Secondary | ICD-10-CM | POA: Diagnosis not present

## 2019-06-14 DIAGNOSIS — F325 Major depressive disorder, single episode, in full remission: Secondary | ICD-10-CM | POA: Diagnosis not present

## 2019-06-14 DIAGNOSIS — J45909 Unspecified asthma, uncomplicated: Secondary | ICD-10-CM | POA: Diagnosis not present

## 2019-06-14 DIAGNOSIS — E039 Hypothyroidism, unspecified: Secondary | ICD-10-CM | POA: Diagnosis not present

## 2019-06-14 DIAGNOSIS — I10 Essential (primary) hypertension: Secondary | ICD-10-CM | POA: Diagnosis not present

## 2019-06-14 DIAGNOSIS — M1990 Unspecified osteoarthritis, unspecified site: Secondary | ICD-10-CM | POA: Diagnosis not present

## 2019-06-26 ENCOUNTER — Ambulatory Visit: Payer: Medicare HMO | Admitting: Orthopaedic Surgery

## 2019-07-10 DIAGNOSIS — L409 Psoriasis, unspecified: Secondary | ICD-10-CM | POA: Diagnosis not present

## 2019-07-10 DIAGNOSIS — Z6826 Body mass index (BMI) 26.0-26.9, adult: Secondary | ICD-10-CM | POA: Diagnosis not present

## 2019-07-10 DIAGNOSIS — E663 Overweight: Secondary | ICD-10-CM | POA: Diagnosis not present

## 2019-07-10 DIAGNOSIS — M154 Erosive (osteo)arthritis: Secondary | ICD-10-CM | POA: Diagnosis not present

## 2019-07-10 DIAGNOSIS — M2550 Pain in unspecified joint: Secondary | ICD-10-CM | POA: Diagnosis not present

## 2019-07-10 DIAGNOSIS — Z79899 Other long term (current) drug therapy: Secondary | ICD-10-CM | POA: Diagnosis not present

## 2019-07-10 DIAGNOSIS — M150 Primary generalized (osteo)arthritis: Secondary | ICD-10-CM | POA: Diagnosis not present

## 2019-07-18 DIAGNOSIS — Z6826 Body mass index (BMI) 26.0-26.9, adult: Secondary | ICD-10-CM | POA: Diagnosis not present

## 2019-07-18 DIAGNOSIS — Z1231 Encounter for screening mammogram for malignant neoplasm of breast: Secondary | ICD-10-CM | POA: Diagnosis not present

## 2019-07-18 DIAGNOSIS — Z01419 Encounter for gynecological examination (general) (routine) without abnormal findings: Secondary | ICD-10-CM | POA: Diagnosis not present

## 2019-08-09 DIAGNOSIS — J449 Chronic obstructive pulmonary disease, unspecified: Secondary | ICD-10-CM | POA: Diagnosis not present

## 2019-08-09 DIAGNOSIS — J45909 Unspecified asthma, uncomplicated: Secondary | ICD-10-CM | POA: Diagnosis not present

## 2019-08-09 DIAGNOSIS — M1990 Unspecified osteoarthritis, unspecified site: Secondary | ICD-10-CM | POA: Diagnosis not present

## 2019-08-09 DIAGNOSIS — F325 Major depressive disorder, single episode, in full remission: Secondary | ICD-10-CM | POA: Diagnosis not present

## 2019-08-09 DIAGNOSIS — I10 Essential (primary) hypertension: Secondary | ICD-10-CM | POA: Diagnosis not present

## 2019-08-09 DIAGNOSIS — F329 Major depressive disorder, single episode, unspecified: Secondary | ICD-10-CM | POA: Diagnosis not present

## 2019-08-09 DIAGNOSIS — J4540 Moderate persistent asthma, uncomplicated: Secondary | ICD-10-CM | POA: Diagnosis not present

## 2019-08-09 DIAGNOSIS — M154 Erosive (osteo)arthritis: Secondary | ICD-10-CM | POA: Diagnosis not present

## 2019-08-09 DIAGNOSIS — E039 Hypothyroidism, unspecified: Secondary | ICD-10-CM | POA: Diagnosis not present

## 2019-09-14 DIAGNOSIS — H04123 Dry eye syndrome of bilateral lacrimal glands: Secondary | ICD-10-CM | POA: Diagnosis not present

## 2019-09-14 DIAGNOSIS — H43813 Vitreous degeneration, bilateral: Secondary | ICD-10-CM | POA: Diagnosis not present

## 2019-09-14 DIAGNOSIS — Z961 Presence of intraocular lens: Secondary | ICD-10-CM | POA: Diagnosis not present

## 2019-09-25 DIAGNOSIS — M154 Erosive (osteo)arthritis: Secondary | ICD-10-CM | POA: Diagnosis not present

## 2019-09-25 DIAGNOSIS — F325 Major depressive disorder, single episode, in full remission: Secondary | ICD-10-CM | POA: Diagnosis not present

## 2019-09-25 DIAGNOSIS — F329 Major depressive disorder, single episode, unspecified: Secondary | ICD-10-CM | POA: Diagnosis not present

## 2019-09-25 DIAGNOSIS — J45909 Unspecified asthma, uncomplicated: Secondary | ICD-10-CM | POA: Diagnosis not present

## 2019-09-25 DIAGNOSIS — J449 Chronic obstructive pulmonary disease, unspecified: Secondary | ICD-10-CM | POA: Diagnosis not present

## 2019-09-25 DIAGNOSIS — I10 Essential (primary) hypertension: Secondary | ICD-10-CM | POA: Diagnosis not present

## 2019-09-25 DIAGNOSIS — J4540 Moderate persistent asthma, uncomplicated: Secondary | ICD-10-CM | POA: Diagnosis not present

## 2019-09-25 DIAGNOSIS — E039 Hypothyroidism, unspecified: Secondary | ICD-10-CM | POA: Diagnosis not present

## 2019-09-25 DIAGNOSIS — M1990 Unspecified osteoarthritis, unspecified site: Secondary | ICD-10-CM | POA: Diagnosis not present

## 2019-10-18 DIAGNOSIS — J45909 Unspecified asthma, uncomplicated: Secondary | ICD-10-CM | POA: Diagnosis not present

## 2019-10-18 DIAGNOSIS — M1990 Unspecified osteoarthritis, unspecified site: Secondary | ICD-10-CM | POA: Diagnosis not present

## 2019-10-18 DIAGNOSIS — J449 Chronic obstructive pulmonary disease, unspecified: Secondary | ICD-10-CM | POA: Diagnosis not present

## 2019-10-18 DIAGNOSIS — F329 Major depressive disorder, single episode, unspecified: Secondary | ICD-10-CM | POA: Diagnosis not present

## 2019-10-18 DIAGNOSIS — J4540 Moderate persistent asthma, uncomplicated: Secondary | ICD-10-CM | POA: Diagnosis not present

## 2019-10-18 DIAGNOSIS — F325 Major depressive disorder, single episode, in full remission: Secondary | ICD-10-CM | POA: Diagnosis not present

## 2019-10-18 DIAGNOSIS — E039 Hypothyroidism, unspecified: Secondary | ICD-10-CM | POA: Diagnosis not present

## 2019-10-18 DIAGNOSIS — M154 Erosive (osteo)arthritis: Secondary | ICD-10-CM | POA: Diagnosis not present

## 2019-10-18 DIAGNOSIS — I10 Essential (primary) hypertension: Secondary | ICD-10-CM | POA: Diagnosis not present

## 2019-11-20 DIAGNOSIS — F329 Major depressive disorder, single episode, unspecified: Secondary | ICD-10-CM | POA: Diagnosis not present

## 2019-11-20 DIAGNOSIS — J45909 Unspecified asthma, uncomplicated: Secondary | ICD-10-CM | POA: Diagnosis not present

## 2019-11-20 DIAGNOSIS — M1990 Unspecified osteoarthritis, unspecified site: Secondary | ICD-10-CM | POA: Diagnosis not present

## 2019-11-20 DIAGNOSIS — E039 Hypothyroidism, unspecified: Secondary | ICD-10-CM | POA: Diagnosis not present

## 2019-11-20 DIAGNOSIS — I10 Essential (primary) hypertension: Secondary | ICD-10-CM | POA: Diagnosis not present

## 2019-11-20 DIAGNOSIS — M154 Erosive (osteo)arthritis: Secondary | ICD-10-CM | POA: Diagnosis not present

## 2019-11-20 DIAGNOSIS — F325 Major depressive disorder, single episode, in full remission: Secondary | ICD-10-CM | POA: Diagnosis not present

## 2019-11-20 DIAGNOSIS — J4540 Moderate persistent asthma, uncomplicated: Secondary | ICD-10-CM | POA: Diagnosis not present

## 2019-11-20 DIAGNOSIS — J449 Chronic obstructive pulmonary disease, unspecified: Secondary | ICD-10-CM | POA: Diagnosis not present

## 2019-12-26 DIAGNOSIS — H52222 Regular astigmatism, left eye: Secondary | ICD-10-CM | POA: Diagnosis not present

## 2019-12-26 DIAGNOSIS — H524 Presbyopia: Secondary | ICD-10-CM | POA: Diagnosis not present

## 2020-01-02 DIAGNOSIS — Z0000 Encounter for general adult medical examination without abnormal findings: Secondary | ICD-10-CM | POA: Diagnosis not present

## 2020-01-02 DIAGNOSIS — J4540 Moderate persistent asthma, uncomplicated: Secondary | ICD-10-CM | POA: Diagnosis not present

## 2020-01-02 DIAGNOSIS — F325 Major depressive disorder, single episode, in full remission: Secondary | ICD-10-CM | POA: Diagnosis not present

## 2020-01-02 DIAGNOSIS — I10 Essential (primary) hypertension: Secondary | ICD-10-CM | POA: Diagnosis not present

## 2020-01-02 DIAGNOSIS — E7800 Pure hypercholesterolemia, unspecified: Secondary | ICD-10-CM | POA: Diagnosis not present

## 2020-01-02 DIAGNOSIS — M154 Erosive (osteo)arthritis: Secondary | ICD-10-CM | POA: Diagnosis not present

## 2020-01-02 DIAGNOSIS — G43909 Migraine, unspecified, not intractable, without status migrainosus: Secondary | ICD-10-CM | POA: Diagnosis not present

## 2020-01-02 DIAGNOSIS — E039 Hypothyroidism, unspecified: Secondary | ICD-10-CM | POA: Diagnosis not present

## 2020-01-09 DIAGNOSIS — M154 Erosive (osteo)arthritis: Secondary | ICD-10-CM | POA: Diagnosis not present

## 2020-01-09 DIAGNOSIS — Z0000 Encounter for general adult medical examination without abnormal findings: Secondary | ICD-10-CM | POA: Diagnosis not present

## 2020-01-09 DIAGNOSIS — G43909 Migraine, unspecified, not intractable, without status migrainosus: Secondary | ICD-10-CM | POA: Diagnosis not present

## 2020-01-09 DIAGNOSIS — J4540 Moderate persistent asthma, uncomplicated: Secondary | ICD-10-CM | POA: Diagnosis not present

## 2020-01-09 DIAGNOSIS — F325 Major depressive disorder, single episode, in full remission: Secondary | ICD-10-CM | POA: Diagnosis not present

## 2020-01-09 DIAGNOSIS — I10 Essential (primary) hypertension: Secondary | ICD-10-CM | POA: Diagnosis not present

## 2020-01-09 DIAGNOSIS — E7800 Pure hypercholesterolemia, unspecified: Secondary | ICD-10-CM | POA: Diagnosis not present

## 2020-01-09 DIAGNOSIS — E039 Hypothyroidism, unspecified: Secondary | ICD-10-CM | POA: Diagnosis not present

## 2020-01-18 DIAGNOSIS — E039 Hypothyroidism, unspecified: Secondary | ICD-10-CM | POA: Diagnosis not present

## 2020-01-18 DIAGNOSIS — J4540 Moderate persistent asthma, uncomplicated: Secondary | ICD-10-CM | POA: Diagnosis not present

## 2020-01-18 DIAGNOSIS — M154 Erosive (osteo)arthritis: Secondary | ICD-10-CM | POA: Diagnosis not present

## 2020-01-18 DIAGNOSIS — M1990 Unspecified osteoarthritis, unspecified site: Secondary | ICD-10-CM | POA: Diagnosis not present

## 2020-01-18 DIAGNOSIS — J45909 Unspecified asthma, uncomplicated: Secondary | ICD-10-CM | POA: Diagnosis not present

## 2020-01-18 DIAGNOSIS — F329 Major depressive disorder, single episode, unspecified: Secondary | ICD-10-CM | POA: Diagnosis not present

## 2020-01-18 DIAGNOSIS — I10 Essential (primary) hypertension: Secondary | ICD-10-CM | POA: Diagnosis not present

## 2020-01-18 DIAGNOSIS — J449 Chronic obstructive pulmonary disease, unspecified: Secondary | ICD-10-CM | POA: Diagnosis not present

## 2020-01-18 DIAGNOSIS — F325 Major depressive disorder, single episode, in full remission: Secondary | ICD-10-CM | POA: Diagnosis not present

## 2020-01-28 DIAGNOSIS — F325 Major depressive disorder, single episode, in full remission: Secondary | ICD-10-CM | POA: Diagnosis not present

## 2020-01-28 DIAGNOSIS — J449 Chronic obstructive pulmonary disease, unspecified: Secondary | ICD-10-CM | POA: Diagnosis not present

## 2020-01-28 DIAGNOSIS — E039 Hypothyroidism, unspecified: Secondary | ICD-10-CM | POA: Diagnosis not present

## 2020-01-28 DIAGNOSIS — J4540 Moderate persistent asthma, uncomplicated: Secondary | ICD-10-CM | POA: Diagnosis not present

## 2020-01-28 DIAGNOSIS — M1990 Unspecified osteoarthritis, unspecified site: Secondary | ICD-10-CM | POA: Diagnosis not present

## 2020-01-28 DIAGNOSIS — I10 Essential (primary) hypertension: Secondary | ICD-10-CM | POA: Diagnosis not present

## 2020-01-28 DIAGNOSIS — M154 Erosive (osteo)arthritis: Secondary | ICD-10-CM | POA: Diagnosis not present

## 2020-01-28 DIAGNOSIS — F329 Major depressive disorder, single episode, unspecified: Secondary | ICD-10-CM | POA: Diagnosis not present

## 2020-01-28 DIAGNOSIS — J45909 Unspecified asthma, uncomplicated: Secondary | ICD-10-CM | POA: Diagnosis not present

## 2020-01-30 DIAGNOSIS — I8311 Varicose veins of right lower extremity with inflammation: Secondary | ICD-10-CM | POA: Diagnosis not present

## 2020-01-30 DIAGNOSIS — I83813 Varicose veins of bilateral lower extremities with pain: Secondary | ICD-10-CM | POA: Diagnosis not present

## 2020-01-30 DIAGNOSIS — I8312 Varicose veins of left lower extremity with inflammation: Secondary | ICD-10-CM | POA: Diagnosis not present

## 2020-02-05 DIAGNOSIS — I83813 Varicose veins of bilateral lower extremities with pain: Secondary | ICD-10-CM | POA: Diagnosis not present

## 2020-02-05 DIAGNOSIS — I8311 Varicose veins of right lower extremity with inflammation: Secondary | ICD-10-CM | POA: Diagnosis not present

## 2020-02-05 DIAGNOSIS — I8312 Varicose veins of left lower extremity with inflammation: Secondary | ICD-10-CM | POA: Diagnosis not present

## 2020-02-14 DIAGNOSIS — I8311 Varicose veins of right lower extremity with inflammation: Secondary | ICD-10-CM | POA: Diagnosis not present

## 2020-02-14 DIAGNOSIS — I83813 Varicose veins of bilateral lower extremities with pain: Secondary | ICD-10-CM | POA: Diagnosis not present

## 2020-02-14 DIAGNOSIS — I8312 Varicose veins of left lower extremity with inflammation: Secondary | ICD-10-CM | POA: Diagnosis not present

## 2020-03-17 DIAGNOSIS — E663 Overweight: Secondary | ICD-10-CM | POA: Diagnosis not present

## 2020-03-17 DIAGNOSIS — Z79899 Other long term (current) drug therapy: Secondary | ICD-10-CM | POA: Diagnosis not present

## 2020-03-17 DIAGNOSIS — M154 Erosive (osteo)arthritis: Secondary | ICD-10-CM | POA: Diagnosis not present

## 2020-03-17 DIAGNOSIS — M150 Primary generalized (osteo)arthritis: Secondary | ICD-10-CM | POA: Diagnosis not present

## 2020-03-17 DIAGNOSIS — L409 Psoriasis, unspecified: Secondary | ICD-10-CM | POA: Diagnosis not present

## 2020-03-17 DIAGNOSIS — Z6826 Body mass index (BMI) 26.0-26.9, adult: Secondary | ICD-10-CM | POA: Diagnosis not present

## 2020-03-17 DIAGNOSIS — M2550 Pain in unspecified joint: Secondary | ICD-10-CM | POA: Diagnosis not present

## 2020-04-23 DIAGNOSIS — M7751 Other enthesopathy of right foot: Secondary | ICD-10-CM | POA: Diagnosis not present

## 2020-04-23 DIAGNOSIS — Z6825 Body mass index (BMI) 25.0-25.9, adult: Secondary | ICD-10-CM | POA: Diagnosis not present

## 2020-05-05 ENCOUNTER — Encounter: Payer: Self-pay | Admitting: Podiatry

## 2020-05-05 ENCOUNTER — Ambulatory Visit (INDEPENDENT_AMBULATORY_CARE_PROVIDER_SITE_OTHER): Payer: Medicare HMO

## 2020-05-05 ENCOUNTER — Other Ambulatory Visit: Payer: Self-pay

## 2020-05-05 ENCOUNTER — Ambulatory Visit: Payer: Medicare HMO | Admitting: Podiatry

## 2020-05-05 VITALS — Temp 97.0°F

## 2020-05-05 DIAGNOSIS — M7661 Achilles tendinitis, right leg: Secondary | ICD-10-CM

## 2020-05-05 DIAGNOSIS — R52 Pain, unspecified: Secondary | ICD-10-CM

## 2020-05-05 NOTE — Progress Notes (Signed)
Subjective:   Patient ID: [name], female   DOB: 74 y.o.   MRN: [id number]   HPI Patient states she has developed a lot of discomfort on the back of the right heel which is gradually gotten worse over the last 5 months.  She does not remember an injury to this area and it is become gradually more of an issue for her over that time and makes shoe gear difficult and activity difficult.  Patient does not smoke likes to be active   Review of Systems  All other systems reviewed and are negative.       Objective:  Physical Exam Vitals and nursing note reviewed.  Constitutional:      Appearance: She is well-developed.  Pulmonary:     Effort: Pulmonary effort is normal.  Musculoskeletal:        General: Normal range of motion.  Skin:    General: Skin is warm.  Neurological:     Mental Status: She is alert.     Neurovascular status intact muscle strength found to be adequate range of motion within normal limits.  Patient is noted to have exquisite discomfort posterior lateral aspect of the right Achilles tendon as it inserts into the posterior calcaneus.  Patient has good digital perfusion well oriented x3     Assessment:  Acute Achilles tendinitis right lateral side     Plan:  H&P conditions reviewed and today went ahead and explained injection and risk of rupture.  She wants procedure understanding risk and today went ahead did sterile prep and injected the lateral side of the Achilles tendon being careful not to go central or medial 3 mg dexamethasone 5 mg Xylocaine.  Advised on reduced activity reappoint  X-rays indicate small spur no indications of stress fracture arthritis

## 2020-05-06 ENCOUNTER — Other Ambulatory Visit: Payer: Self-pay | Admitting: Podiatry

## 2020-05-06 DIAGNOSIS — M7661 Achilles tendinitis, right leg: Secondary | ICD-10-CM

## 2020-05-07 DIAGNOSIS — J4540 Moderate persistent asthma, uncomplicated: Secondary | ICD-10-CM | POA: Diagnosis not present

## 2020-05-07 DIAGNOSIS — E7800 Pure hypercholesterolemia, unspecified: Secondary | ICD-10-CM | POA: Diagnosis not present

## 2020-05-07 DIAGNOSIS — J449 Chronic obstructive pulmonary disease, unspecified: Secondary | ICD-10-CM | POA: Diagnosis not present

## 2020-05-07 DIAGNOSIS — I10 Essential (primary) hypertension: Secondary | ICD-10-CM | POA: Diagnosis not present

## 2020-05-07 DIAGNOSIS — F329 Major depressive disorder, single episode, unspecified: Secondary | ICD-10-CM | POA: Diagnosis not present

## 2020-05-07 DIAGNOSIS — J45909 Unspecified asthma, uncomplicated: Secondary | ICD-10-CM | POA: Diagnosis not present

## 2020-05-07 DIAGNOSIS — E039 Hypothyroidism, unspecified: Secondary | ICD-10-CM | POA: Diagnosis not present

## 2020-05-07 DIAGNOSIS — F325 Major depressive disorder, single episode, in full remission: Secondary | ICD-10-CM | POA: Diagnosis not present

## 2020-05-07 DIAGNOSIS — M1990 Unspecified osteoarthritis, unspecified site: Secondary | ICD-10-CM | POA: Diagnosis not present

## 2020-05-22 ENCOUNTER — Encounter: Payer: Self-pay | Admitting: Podiatry

## 2020-05-22 ENCOUNTER — Other Ambulatory Visit: Payer: Self-pay

## 2020-05-22 ENCOUNTER — Ambulatory Visit: Payer: Medicare HMO | Admitting: Podiatry

## 2020-05-22 VITALS — Temp 96.6°F

## 2020-05-22 DIAGNOSIS — M7671 Peroneal tendinitis, right leg: Secondary | ICD-10-CM

## 2020-05-22 DIAGNOSIS — M7661 Achilles tendinitis, right leg: Secondary | ICD-10-CM

## 2020-05-22 NOTE — Progress Notes (Signed)
Subjective:  Patient ID: [name], female    DOB: [birth year],  MRN: [id number]  Chief Complaint  Patient presents with  . Follow-up    R achilles tendonitis - lateral. Pt stated, "The injection helped the swelling, but I still have a lot of pain. It's sharp, [intermittent] excruciating pain - 10/10. Starting to feel it on the [medial] side now. I've tried every shoe, but none of them have helped".    74 y.o. female presents with the above complaint.  She saw Dr. Paulla Dolly 2 weeks ago for the same issue, which has been present since she and her husband went to a family wedding and did a lot of dancing in April.  At the last visit Dr. Paulla Dolly injected a steroid which helped a lot of the swelling but her pain is still quite sharp.  This is intermittent and triggered by walking and standing.  No shoes have helped, and in fact make her foot feel worse and anything that touches the outside and back of the heel is quite tender for her. Past Medical History:  Diagnosis Date  . Anxiety   . Arthritis   . Asthma   . Depression   . GERD (gastroesophageal reflux disease)   . Headache(784.0)   . Hypertension   . Hypothyroidism   . PONV (postoperative nausea and vomiting)   . Wears glasses    Past Surgical History:  Procedure Laterality Date  . ABDOMINAL HYSTERECTOMY  1981  . APPENDECTOMY     age 88  . CHOLECYSTECTOMY  1/10   lap choli  . COLONOSCOPY    . CYSTOCELE REPAIR  8/10   ant/post-rectocele/cystocele repair  . OVARY SURGERY  1995  . SHOULDER ARTHROSCOPY WITH ROTATOR CUFF REPAIR AND SUBACROMIAL DECOMPRESSION  12/12/2012   Procedure: SHOULDER ARTHROSCOPY WITH ROTATOR CUFF REPAIR AND SUBACROMIAL DECOMPRESSION;  Surgeon: Garald Balding, MD;  Location: [facility];  Service: Orthopedics;  Laterality: Right;  Right Shoulder Mini Open Rotator Cuff Repair and Subacromial Decompression, possible Patch.  . TONSILLECTOMY     age 52  . TUBAL LIGATION  1976    Current  Outpatient Medications:  .  amLODipine (NORVASC) 5 MG tablet, Take 5 mg by mouth daily., Disp: , Rfl:  .  budesonide-formoterol (SYMBICORT) 160-4.5 MCG/ACT inhaler, Inhale 2 puffs into the lungs 2 (two) times daily., Disp: , Rfl:  .  calcium-vitamin D (OSCAL WITH D) 500-200 MG-UNIT per tablet, Take 1 tablet by mouth daily., Disp: , Rfl:  .  Esomeprazole Magnesium (NEXIUM PO), Take by mouth., Disp: , Rfl:  .  estradiol (ESTRACE) 2 MG tablet, Take 2 mg by mouth daily., Disp: , Rfl:  .  levETIRAcetam (KEPPRA) 500 MG tablet, Take 500 mg by mouth every morning., Disp: , Rfl:  .  levothyroxine (SYNTHROID, LEVOTHROID) 100 MCG tablet, Take 100 mcg by mouth daily., Disp: , Rfl:  .  rosuvastatin (CRESTOR) 5 MG tablet, Take 5 mg by mouth 2 (two) times a week., Disp: , Rfl:  .  venlafaxine XR (EFFEXOR-XR) 150 MG 24 hr capsule, Take 150 mg by mouth daily., Disp: , Rfl:   Allergies  Allergen Reactions  . Doxycycline Nausea And Vomiting  . Cleocin [Clindamycin Hcl] Rash   Review of Systems: Negative except as noted in the HPI. Denies N/V/F/Ch. Objective:   Vitals:   05/22/20 1138  Temp: (!) 96.6 F (35.9 C)   General AA&O x3. Normal mood and affect.  Vascular Dorsalis pedis and posterior tibial pulses  present 2+ bilaterally  Capillary refill normal to all digits. Pedal hair growth normal.  Neurologic Epicritic sensation grossly present.  Dermatologic No open lesions. Interspaces clear of maceration. Nails well groomed and normal in appearance.  Orthopedic:  Today she has mild tenderness to the insertion of the Achilles tendon.  She does have significant tenderness worse in the Achilles along the peroneal tendons in the retromalleolar area.  No tenderness at the fifth metatarsal base or plantar midfoot.  She does have pain with resisted eversion and her strength here is 4 out of 5.  5 out of 5 in all other planes.   Assessment & Plan:  Patient was evaluated and treated and all questions  answered.  Encounter Diagnoses  Name Primary?  . Achilles tendinitis of right lower extremity Yes  . Peroneal tendinitis of right lower extremity     -Tri-Lock ankle brace dispensed for immobilization.  Reviewed the rice protocol with the patient and her husband. -She is currently using Voltaren gel for arthritis in her hands and will and will begin using this on the painful areas up to 3-4 times a day, 4 g administered transdermally. -Recommended a Tuli's heel cup to go online or over-the-counter to allow her to wear closed shoes with out irritating the inflamed tendons. -Consider PT or CAM boot if not improving in 4 to 6 weeks. - Follow-up with Dr. Paulla Dolly or myself at that time.  Lanae Crumbly, DPM 05/22/2020    Return in about 4 weeks (around 06/19/2020).

## 2020-05-22 NOTE — Patient Instructions (Signed)
Look for gel heel cups that cover the back and sides of the heel at the pharmacy in the foot care section or online (such as [facility]):   Tuli's Heavy Duty Gel Heel Cups

## 2020-05-30 ENCOUNTER — Ambulatory Visit: Payer: Medicare HMO | Admitting: Podiatry

## 2020-06-19 ENCOUNTER — Ambulatory Visit: Payer: Medicare HMO | Admitting: Podiatry

## 2020-06-19 ENCOUNTER — Other Ambulatory Visit: Payer: Self-pay

## 2020-06-19 ENCOUNTER — Encounter: Payer: Self-pay | Admitting: Podiatry

## 2020-06-19 DIAGNOSIS — M7661 Achilles tendinitis, right leg: Secondary | ICD-10-CM

## 2020-06-23 NOTE — Progress Notes (Signed)
Subjective:   Patient ID: [name], female   DOB: 74 y.o.   MRN: [id number]   HPI Patient presents stating that she has had a lot of pain in her Achilles tendon and it does not respond well to the Tri-Lock ankle brace.  Patient is using diclofenac cream and stretching but that is not helping either   ROS      Objective:  Physical Exam  Neurovascular status intact with inflammation pain of the posterior heel right with quite a bit of sensitivity and inability to walk with any degree of comfort     Assessment:  Acute Achilles tendinitis right that is not so far responded conservatively     Plan:  H&P reviewed condition and at this point I went ahead and I placed into an air fracture walker to completely immobilize and take all stress off the posterior heel.  Discussed physical therapy and other modalities we may do but I want to see how this responds first and make it is decision for her.  Patient will continue topical medicines and reduced activity

## 2020-07-17 ENCOUNTER — Ambulatory Visit: Payer: Medicare HMO | Admitting: Podiatry

## 2020-07-17 ENCOUNTER — Ambulatory Visit (INDEPENDENT_AMBULATORY_CARE_PROVIDER_SITE_OTHER): Payer: Medicare HMO

## 2020-07-17 ENCOUNTER — Ambulatory Visit: Payer: Medicare HMO

## 2020-07-17 ENCOUNTER — Other Ambulatory Visit: Payer: Self-pay

## 2020-07-17 DIAGNOSIS — M7671 Peroneal tendinitis, right leg: Secondary | ICD-10-CM

## 2020-07-17 DIAGNOSIS — M722 Plantar fascial fibromatosis: Secondary | ICD-10-CM | POA: Diagnosis not present

## 2020-07-17 DIAGNOSIS — M7661 Achilles tendinitis, right leg: Secondary | ICD-10-CM | POA: Diagnosis not present

## 2020-07-17 MED ORDER — PREDNISONE 10 MG PO TABS: ORAL_TABLET | ORAL | 0 refills | Status: DC

## 2020-07-17 MED ORDER — HYDROCODONE-ACETAMINOPHEN 10-325 MG PO TABS: 1.0000 | ORAL_TABLET | Freq: Four times a day (QID) | ORAL | 0 refills | Status: AC | PRN

## 2020-07-17 NOTE — Progress Notes (Signed)
Subjective:   Patient ID: [name], female   DOB: 74 y.o.   MRN: [id number]   HPI Patient presents stating she is still getting intense discomfort on the back of the right heel and she is desperate for any type of relief.  Stated the boot was hard for her to wear and she wore it for a week and then she took it off   ROS      Objective:  Physical Exam  Neurovascular status intact with patient continuing to have significant inflammation fluid buildup around the Achilles tendon insertion into the calcaneus.  Also complaining of discomfort on the bottom of the heel not as intense but present     Assessment:  Acute Achilles tendinitis right that is failed so far to respond to different treatments along with plantar fasciitis     Plan:  H&P reviewed conditions.  I did discuss the back of the heel and took another x-ray today and we are desperately trying to avoid surgery and at this point I did after discussing with her chances for rupture I reinjected with 3 mg Dexasone 5 mg Xylocaine on the medial lateral staying away from the central side.  I then went ahead and I am sending her next week for physical therapy I advised her on reduced activity for the next few days ice and open back shoes with heel lifts.  May require more aggressive treatments depending on response.  Do not recommend treatment for the plantar fascia and discussed the plantar fascial with patient will continue topical medicines and stretching  X-rays indicate slight increase in spur density of the right posterior heel

## 2020-07-18 ENCOUNTER — Telehealth: Payer: Self-pay | Admitting: Podiatry

## 2020-07-18 NOTE — Telephone Encounter (Signed)
brianns from [facility] called stating that pt told her that shes concerened about her foot still in pain [facility] wanted an actual start date for pt therapy

## 2020-07-23 NOTE — Telephone Encounter (Signed)
Thank you :)

## 2020-07-23 NOTE — Telephone Encounter (Signed)
Crystal:  I talked to Dr. Paulla Dolly this morning. He thought patient could go to Physical Therapy the middle of next week since she is having foot pain.  Can you let PT know?  Thank you, Rolly Pancake, CMA (AAMA)

## 2020-07-23 NOTE — Telephone Encounter (Signed)
Called and spoke to pt to let her knowwhat dr Paulla Dolly stated

## 2020-08-01 DIAGNOSIS — R233 Spontaneous ecchymoses: Secondary | ICD-10-CM | POA: Diagnosis not present

## 2020-08-01 DIAGNOSIS — R531 Weakness: Secondary | ICD-10-CM | POA: Diagnosis not present

## 2020-08-01 DIAGNOSIS — R262 Difficulty in walking, not elsewhere classified: Secondary | ICD-10-CM | POA: Diagnosis not present

## 2020-08-01 DIAGNOSIS — M25571 Pain in right ankle and joints of right foot: Secondary | ICD-10-CM | POA: Diagnosis not present

## 2020-08-06 DIAGNOSIS — R233 Spontaneous ecchymoses: Secondary | ICD-10-CM | POA: Diagnosis not present

## 2020-08-06 DIAGNOSIS — M25571 Pain in right ankle and joints of right foot: Secondary | ICD-10-CM | POA: Diagnosis not present

## 2020-08-06 DIAGNOSIS — R531 Weakness: Secondary | ICD-10-CM | POA: Diagnosis not present

## 2020-08-06 DIAGNOSIS — R262 Difficulty in walking, not elsewhere classified: Secondary | ICD-10-CM | POA: Diagnosis not present

## 2020-08-08 DIAGNOSIS — F329 Major depressive disorder, single episode, unspecified: Secondary | ICD-10-CM | POA: Diagnosis not present

## 2020-08-08 DIAGNOSIS — I10 Essential (primary) hypertension: Secondary | ICD-10-CM | POA: Diagnosis not present

## 2020-08-08 DIAGNOSIS — R262 Difficulty in walking, not elsewhere classified: Secondary | ICD-10-CM | POA: Diagnosis not present

## 2020-08-08 DIAGNOSIS — E7800 Pure hypercholesterolemia, unspecified: Secondary | ICD-10-CM | POA: Diagnosis not present

## 2020-08-08 DIAGNOSIS — M1990 Unspecified osteoarthritis, unspecified site: Secondary | ICD-10-CM | POA: Diagnosis not present

## 2020-08-08 DIAGNOSIS — J449 Chronic obstructive pulmonary disease, unspecified: Secondary | ICD-10-CM | POA: Diagnosis not present

## 2020-08-08 DIAGNOSIS — M25571 Pain in right ankle and joints of right foot: Secondary | ICD-10-CM | POA: Diagnosis not present

## 2020-08-08 DIAGNOSIS — R233 Spontaneous ecchymoses: Secondary | ICD-10-CM | POA: Diagnosis not present

## 2020-08-08 DIAGNOSIS — F325 Major depressive disorder, single episode, in full remission: Secondary | ICD-10-CM | POA: Diagnosis not present

## 2020-08-08 DIAGNOSIS — J4540 Moderate persistent asthma, uncomplicated: Secondary | ICD-10-CM | POA: Diagnosis not present

## 2020-08-08 DIAGNOSIS — J45909 Unspecified asthma, uncomplicated: Secondary | ICD-10-CM | POA: Diagnosis not present

## 2020-08-08 DIAGNOSIS — E039 Hypothyroidism, unspecified: Secondary | ICD-10-CM | POA: Diagnosis not present

## 2020-08-08 DIAGNOSIS — R531 Weakness: Secondary | ICD-10-CM | POA: Diagnosis not present

## 2020-08-11 DIAGNOSIS — R262 Difficulty in walking, not elsewhere classified: Secondary | ICD-10-CM | POA: Diagnosis not present

## 2020-08-11 DIAGNOSIS — R233 Spontaneous ecchymoses: Secondary | ICD-10-CM | POA: Diagnosis not present

## 2020-08-11 DIAGNOSIS — M25571 Pain in right ankle and joints of right foot: Secondary | ICD-10-CM | POA: Diagnosis not present

## 2020-08-11 DIAGNOSIS — R531 Weakness: Secondary | ICD-10-CM | POA: Diagnosis not present

## 2020-08-14 ENCOUNTER — Encounter: Payer: Self-pay | Admitting: Podiatry

## 2020-08-14 ENCOUNTER — Other Ambulatory Visit: Payer: Self-pay

## 2020-08-14 ENCOUNTER — Ambulatory Visit: Payer: Medicare HMO | Admitting: Podiatry

## 2020-08-14 DIAGNOSIS — M722 Plantar fascial fibromatosis: Secondary | ICD-10-CM

## 2020-08-14 DIAGNOSIS — M7661 Achilles tendinitis, right leg: Secondary | ICD-10-CM | POA: Diagnosis not present

## 2020-08-15 DIAGNOSIS — R233 Spontaneous ecchymoses: Secondary | ICD-10-CM | POA: Diagnosis not present

## 2020-08-15 DIAGNOSIS — M25571 Pain in right ankle and joints of right foot: Secondary | ICD-10-CM | POA: Diagnosis not present

## 2020-08-15 DIAGNOSIS — R531 Weakness: Secondary | ICD-10-CM | POA: Diagnosis not present

## 2020-08-15 DIAGNOSIS — R262 Difficulty in walking, not elsewhere classified: Secondary | ICD-10-CM | POA: Diagnosis not present

## 2020-08-18 DIAGNOSIS — M25571 Pain in right ankle and joints of right foot: Secondary | ICD-10-CM | POA: Diagnosis not present

## 2020-08-18 DIAGNOSIS — R262 Difficulty in walking, not elsewhere classified: Secondary | ICD-10-CM | POA: Diagnosis not present

## 2020-08-18 DIAGNOSIS — R531 Weakness: Secondary | ICD-10-CM | POA: Diagnosis not present

## 2020-08-18 DIAGNOSIS — R233 Spontaneous ecchymoses: Secondary | ICD-10-CM | POA: Diagnosis not present

## 2020-08-19 NOTE — Progress Notes (Signed)
Subjective:   Patient ID: [name], female   DOB: 74 y.o.   MRN: [id number]   HPI Patient presents stating both areas seem to be improving and while she still has occasional discomfort it seems that she is getting better with conservative therapy and physical therapy   ROS      Objective:  Physical Exam  Neurovascular status intact with significant diminishment of discomfort in both the Achilles tendon right the plantar fascial right and also no current peroneal tendinitis right. Patient has better range of motion and is not splinting and seems to be benefiting from aggressive physical therapy     Assessment:  Doing better from having chronic Achilles tendinitis and plantar fascial inflammation     Plan:  Reviewed conditions discussed the comp compensatory nature of this problem and that physical therapy is probably necessary for the next several weeks and also I want her doing home physical therapy shoe gear modifications and we discussed topical medicines with consideration for continued oral medicine as needed. Patient is encouraged to call with any concerns but at this point I do think she is improving and hopefully is through this crisis

## 2020-08-21 DIAGNOSIS — M25571 Pain in right ankle and joints of right foot: Secondary | ICD-10-CM | POA: Diagnosis not present

## 2020-08-21 DIAGNOSIS — R262 Difficulty in walking, not elsewhere classified: Secondary | ICD-10-CM | POA: Diagnosis not present

## 2020-08-21 DIAGNOSIS — R531 Weakness: Secondary | ICD-10-CM | POA: Diagnosis not present

## 2020-08-21 DIAGNOSIS — R233 Spontaneous ecchymoses: Secondary | ICD-10-CM | POA: Diagnosis not present

## 2020-08-26 DIAGNOSIS — R262 Difficulty in walking, not elsewhere classified: Secondary | ICD-10-CM | POA: Diagnosis not present

## 2020-08-26 DIAGNOSIS — R531 Weakness: Secondary | ICD-10-CM | POA: Diagnosis not present

## 2020-08-26 DIAGNOSIS — R233 Spontaneous ecchymoses: Secondary | ICD-10-CM | POA: Diagnosis not present

## 2020-08-26 DIAGNOSIS — M25571 Pain in right ankle and joints of right foot: Secondary | ICD-10-CM | POA: Diagnosis not present

## 2020-08-28 DIAGNOSIS — R233 Spontaneous ecchymoses: Secondary | ICD-10-CM | POA: Diagnosis not present

## 2020-08-28 DIAGNOSIS — R531 Weakness: Secondary | ICD-10-CM | POA: Diagnosis not present

## 2020-08-28 DIAGNOSIS — R262 Difficulty in walking, not elsewhere classified: Secondary | ICD-10-CM | POA: Diagnosis not present

## 2020-08-28 DIAGNOSIS — M25571 Pain in right ankle and joints of right foot: Secondary | ICD-10-CM | POA: Diagnosis not present

## 2020-09-01 ENCOUNTER — Other Ambulatory Visit: Payer: Self-pay

## 2020-09-01 ENCOUNTER — Ambulatory Visit (INDEPENDENT_AMBULATORY_CARE_PROVIDER_SITE_OTHER): Payer: Medicare HMO | Admitting: Podiatry

## 2020-09-01 ENCOUNTER — Ambulatory Visit (INDEPENDENT_AMBULATORY_CARE_PROVIDER_SITE_OTHER): Payer: Medicare HMO

## 2020-09-01 DIAGNOSIS — R52 Pain, unspecified: Secondary | ICD-10-CM

## 2020-09-01 DIAGNOSIS — R600 Localized edema: Secondary | ICD-10-CM | POA: Diagnosis not present

## 2020-09-01 MED ORDER — HYDROCODONE-ACETAMINOPHEN 10-325 MG PO TABS: 1.0000 | ORAL_TABLET | Freq: Three times a day (TID) | ORAL | 0 refills | Status: AC | PRN

## 2020-09-03 NOTE — Progress Notes (Signed)
Subjective:   Patient ID: [name], female   DOB: 74 y.o.   MRN: [id number]   HPI Patient states that she fell last week and injured her right foot and she is concerned about fracture and the discoloration with quite a bit of discomfort   ROS      Objective:  Physical Exam  Neurovascular status intact with patient's right ankle medial lateral side showing quite a bit of ecchymosis and edema with pain upon pressure.  There is diminished range of motion but it appears to be more due to her splinting because of the pain that she is in currently.  Patient had negative Bevelyn Buckles' sign or any other indications of pathology     Assessment:  Significant injury to the right ankle with quite a bit of localized edema with no proximal edema or other indications of other pathology     Plan:  H&P x-rays reviewed and I applied Unna boot to try to reduce the swelling along with elevation compression and surgical shoe.  Patient will be seen back if symptoms persist she will leave this on 3 days but will take it off earlier if any pathology were to occur  X-rays indicate that there is no signs of a fracture associated with this condition appears to be strictly soft tissue injury

## 2020-09-16 DIAGNOSIS — L409 Psoriasis, unspecified: Secondary | ICD-10-CM | POA: Diagnosis not present

## 2020-09-16 DIAGNOSIS — M2550 Pain in unspecified joint: Secondary | ICD-10-CM | POA: Diagnosis not present

## 2020-09-16 DIAGNOSIS — M150 Primary generalized (osteo)arthritis: Secondary | ICD-10-CM | POA: Diagnosis not present

## 2020-09-16 DIAGNOSIS — M154 Erosive (osteo)arthritis: Secondary | ICD-10-CM | POA: Diagnosis not present

## 2020-09-16 DIAGNOSIS — Z79899 Other long term (current) drug therapy: Secondary | ICD-10-CM | POA: Diagnosis not present

## 2020-09-16 DIAGNOSIS — E663 Overweight: Secondary | ICD-10-CM | POA: Diagnosis not present

## 2020-09-16 DIAGNOSIS — Z6825 Body mass index (BMI) 25.0-25.9, adult: Secondary | ICD-10-CM | POA: Diagnosis not present

## 2020-09-18 ENCOUNTER — Other Ambulatory Visit: Payer: Self-pay | Admitting: Orthopedic Surgery

## 2020-09-18 ENCOUNTER — Encounter: Payer: Self-pay | Admitting: Orthopaedic Surgery

## 2020-09-18 ENCOUNTER — Other Ambulatory Visit: Payer: Self-pay

## 2020-09-18 ENCOUNTER — Ambulatory Visit: Payer: Medicare HMO | Admitting: Orthopaedic Surgery

## 2020-09-18 DIAGNOSIS — S86011A Strain of right Achilles tendon, initial encounter: Secondary | ICD-10-CM

## 2020-09-18 DIAGNOSIS — S86012A Strain of left Achilles tendon, initial encounter: Secondary | ICD-10-CM

## 2020-09-18 NOTE — Progress Notes (Signed)
Office Visit Note   Patient: [name]           Date of Birth: 01-13-1946           MRN: [id number] Visit Date: 09/18/2020              Requested by: Kathyrn Lass, [place],  [place] [id number] PCP: Kathyrn Lass, MD   Assessment & Plan: Visit Diagnoses:  1. Achilles tendon rupture, right, initial encounter     Plan:  #1: At this time we are going to order a stat MRI scan to evaluate this area and try to determine the chronicity of this possible tear. #2: She has a boot at home which she apparently had tried to wear but it is very painful at that area. #3: She states she is going to limit her ambulation until after the MRI scan in our evaluation.   Follow-Up Instructions: Return for follow up after STAT MRI.   Orders:  No orders of the defined types were placed in this encounter.  No orders of the defined types were placed in this encounter.     Procedures: No procedures performed   Clinical Data: No additional findings.   Subjective: Chief Complaint  Patient presents with  . Right Foot - Pain   HPI Patient presents today for right foot pain. She said that she has been having pain in her foot since May of 2021, with no injury that started it. She has been to a podiatrist multiple times, along with the rheumatologist, and PCP. She has had two cortisone injections, one in the lateral foot and the other posterior ankle. She has tried physical therapy and a walking boot. She said that her pain is worsening and nothing has helped. She walks with a limp and states that her left leg is weak. She has difficulty sleeping at night and states that the sheets touching her foot causes pain.   Review of Systems  Constitutional: Negative for fatigue.  HENT: Negative for ear pain.   Eyes: Negative for pain.  Respiratory: Positive for shortness of breath.   Cardiovascular: Negative for leg swelling.  Gastrointestinal: Negative for constipation and  diarrhea.  Endocrine: Negative for cold intolerance and heat intolerance.  Genitourinary: Negative for difficulty urinating.  Musculoskeletal: Positive for joint swelling.  Skin: Positive for rash.  Allergic/Immunologic: Negative for food allergies.  Neurological: Positive for weakness.  Hematological: Does not bruise/bleed easily.  Psychiatric/Behavioral: Positive for sleep disturbance.     Objective: Vital Signs: Ht 5\' 3"  (1.6 m)   Wt 146 lb (66.2 kg)   BMI 25.86 kg/m   Physical Exam Constitutional:      Appearance: Normal appearance. She is well-developed and normal weight.  HENT:     Head: Normocephalic.     Nose: Nose normal.  Eyes:     Pupils: Pupils are equal, round, and reactive to light.  Pulmonary:     Effort: Pulmonary effort is normal.  Skin:    General: Skin is warm and dry.  Neurological:     Mental Status: She is alert and oriented to person, place, and time.  Psychiatric:        Behavior: Behavior normal.     Ortho Exam  Exam today reveals a palpable gap just proximal to the calcaneus.  This Is between what appears to be the tendon and the bone.  She has very difficult time plantar flexing the foot.  She has pain with ambulation  in the exam room.  Gastroc squeeze does not reveal any motion of the foot.  Specialty Comments:  No specialty comments available.  Imaging: No results found.   PMFS History: Current Outpatient Medications  Medication Sig Dispense Refill  . amLODipine (NORVASC) 5 MG tablet Take 5 mg by mouth daily.    . budesonide-formoterol (SYMBICORT) 160-4.5 MCG/ACT inhaler Inhale 2 puffs into the lungs 2 (two) times daily.    . calcium-vitamin D (OSCAL WITH D) 500-200 MG-UNIT per tablet Take 1 tablet by mouth daily.    . Esomeprazole Magnesium (NEXIUM PO) Take by mouth.    . estradiol (ESTRACE) 2 MG tablet Take 2 mg by mouth daily.    . hydroxychloroquine (PLAQUENIL) 200 MG tablet     . levETIRAcetam (KEPPRA) 500 MG tablet Take 500 mg  by mouth every morning.    Marland Kitchen levothyroxine (SYNTHROID, LEVOTHROID) 100 MCG tablet Take 100 mcg by mouth daily.    . predniSONE (DELTASONE) 10 MG tablet 12 day tapering dose 48 tablet 0  . rosuvastatin (CRESTOR) 5 MG tablet Take 5 mg by mouth 2 (two) times a week.    . venlafaxine XR (EFFEXOR-XR) 150 MG 24 hr capsule Take 150 mg by mouth daily.     No current facility-administered medications for this visit.    Patient Active Problem List   Diagnosis Date Noted  . Achilles tendon rupture, right, initial encounter 09/18/2020  . Degeneration of lumbar intervertebral disc 08/01/2018  . Lumbar radiculopathy 08/01/2018   Past Medical History:  Diagnosis Date  . Anxiety   . Arthritis   . Asthma   . Depression   . GERD (gastroesophageal reflux disease)   . Headache(784.0)   . Hypertension   . Hypothyroidism   . PONV (postoperative nausea and vomiting)   . Wears glasses     History reviewed. No pertinent family history.  Past Surgical History:  Procedure Laterality Date  . ABDOMINAL HYSTERECTOMY  1981  . APPENDECTOMY     age 46  . CHOLECYSTECTOMY  1/10   lap choli  . COLONOSCOPY    . CYSTOCELE REPAIR  8/10   ant/post-rectocele/cystocele repair  . OVARY SURGERY  1995  . SHOULDER ARTHROSCOPY WITH ROTATOR CUFF REPAIR AND SUBACROMIAL DECOMPRESSION  12/12/2012   Procedure: SHOULDER ARTHROSCOPY WITH ROTATOR CUFF REPAIR AND SUBACROMIAL DECOMPRESSION;  Surgeon: Garald Balding, MD;  Location: [facility];  Service: Orthopedics;  Laterality: Right;  Right Shoulder Mini Open Rotator Cuff Repair and Subacromial Decompression, possible Patch.  . TONSILLECTOMY     age 35  . TUBAL LIGATION  1976   Social History   Occupational History  . Not on file  Tobacco Use  . Smoking status: Former Smoker    Quit date: 12/07/1988    Years since quitting: 31.8  Substance and Sexual Activity  . Alcohol use: Yes    Comment: occ  . Drug use: Not on file  . Sexual activity: Not on  file

## 2020-09-19 ENCOUNTER — Telehealth: Payer: Self-pay | Admitting: Orthopaedic Surgery

## 2020-09-19 ENCOUNTER — Other Ambulatory Visit: Payer: Self-pay | Admitting: Orthopaedic Surgery

## 2020-09-19 MED ORDER — DIAZEPAM 10 MG PO TABS: 10.0000 mg | ORAL_TABLET | Freq: Once | ORAL | 0 refills | Status: AC

## 2020-09-19 NOTE — Telephone Encounter (Signed)
Please advise 

## 2020-09-19 NOTE — Telephone Encounter (Signed)
Sent valium

## 2020-09-19 NOTE — Telephone Encounter (Signed)
Pt called stating she is having an MRI this weekend and she gets claustrophobic; she would like to have something sent in so she can stay calm. Pt would like a CB when this has been sent in  [phone]

## 2020-09-24 ENCOUNTER — Ambulatory Visit
Admission: RE | Admit: 2020-09-24 | Discharge: 2020-09-24 | Disposition: A | Discharge status: Home / Self Care | Payer: Medicare HMO | Source: Ambulatory Visit | Attending: Orthopedic Surgery | Admitting: Orthopedic Surgery

## 2020-09-24 ENCOUNTER — Other Ambulatory Visit: Payer: Self-pay

## 2020-09-24 DIAGNOSIS — S86311A Strain of muscle(s) and tendon(s) of peroneal muscle group at lower leg level, right leg, initial encounter: Secondary | ICD-10-CM | POA: Diagnosis not present

## 2020-09-24 DIAGNOSIS — R600 Localized edema: Secondary | ICD-10-CM | POA: Diagnosis not present

## 2020-09-24 DIAGNOSIS — S86011A Strain of right Achilles tendon, initial encounter: Secondary | ICD-10-CM | POA: Diagnosis not present

## 2020-09-24 DIAGNOSIS — S86012A Strain of left Achilles tendon, initial encounter: Secondary | ICD-10-CM

## 2020-09-24 MED ORDER — GADOBENATE DIMEGLUMINE 529 MG/ML IV SOLN
15.0000 mL | Freq: Once | INTRAVENOUS | Status: AC | PRN
  Administered 2020-09-24: 15 mL via INTRAVENOUS

## 2020-09-30 ENCOUNTER — Encounter: Payer: Self-pay | Admitting: Orthopaedic Surgery

## 2020-09-30 ENCOUNTER — Other Ambulatory Visit: Payer: Self-pay

## 2020-09-30 ENCOUNTER — Ambulatory Visit: Payer: Medicare HMO | Admitting: Orthopaedic Surgery

## 2020-09-30 VITALS — Ht 63.0 in | Wt 146.0 lb

## 2020-09-30 DIAGNOSIS — S86011A Strain of right Achilles tendon, initial encounter: Secondary | ICD-10-CM

## 2020-09-30 NOTE — Progress Notes (Signed)
Office Visit Note   Patient: [name]           Date of Birth: 12-19-1945           MRN: [id number] Visit Date: 09/30/2020              Requested by: Kathyrn Lass, [place],  [place] [address] PCP: Kathyrn Lass, MD   Assessment & Plan: Visit Diagnoses:  1. Achilles tendon rupture, right, initial encounter     Plan:  #1: She has been tried in the boot previously but it was too painful for her. #2: Regarding to refer her to Dr. Sharol Given for evaluation of not the Achilles tendon but also the peroneus brevis.  Follow-Up Instructions: Dr Sharol Given to evaluate  Orders:  No orders of the defined types were placed in this encounter.  No orders of the defined types were placed in this encounter.     Procedures: No procedures performed   Clinical Data: No additional findings.   Subjective: Chief Complaint  Patient presents with  . Right Ankle - Follow-up    MRI review   HPI Patient presents today for follow up on her right ankle. She had an MRI and is here today for those results. She said that it continues to be painful and swell. The swelling gets worse with weightbearing. She takes Ibuprofen if needed. She is unable to weak the walking boot because it hurts the backside of her ankle.    Review of Systems  Constitutional: Negative for fatigue.  HENT: Negative for congestion.   Eyes: Negative for pain.  Respiratory: Positive for shortness of breath.   Cardiovascular: Negative for leg swelling.  Gastrointestinal: Negative for constipation and diarrhea.  Endocrine: Negative for cold intolerance and heat intolerance.  Genitourinary: Negative for difficulty urinating.  Musculoskeletal: Positive for joint swelling.  Skin: Positive for rash.  Allergic/Immunologic: Negative for food allergies.  Neurological: Negative for weakness.  Hematological: Does not bruise/bleed easily.  Psychiatric/Behavioral: Positive for sleep disturbance.      Objective: Vital Signs: Ht 5\' 3"  (1.6 m)   Wt 146 lb (66.2 kg)   BMI 25.86 kg/m   Physical Exam  Ortho Exam  palpable gap just proximal to the calcaneus.  This Is between what appears to be the tendon and the bone.  She has very difficult time plantar flexing the foot.  She has pain with ambulation in the exam room.  Gastroc squeeze does not reveal any motion of the foot.  Specialty Comments:  No specialty comments available.  Imaging: No results found.   PMFS History: Current Outpatient Medications  Medication Sig Dispense Refill  . amLODipine (NORVASC) 5 MG tablet Take 5 mg by mouth daily.    . budesonide-formoterol (SYMBICORT) 160-4.5 MCG/ACT inhaler Inhale 2 puffs into the lungs 2 (two) times daily.    . calcium-vitamin D (OSCAL WITH D) 500-200 MG-UNIT per tablet Take 1 tablet by mouth daily.    . Esomeprazole Magnesium (NEXIUM PO) Take by mouth.    . estradiol (ESTRACE) 2 MG tablet Take 2 mg by mouth daily.    . hydroxychloroquine (PLAQUENIL) 200 MG tablet     . levETIRAcetam (KEPPRA) 500 MG tablet Take 500 mg by mouth every morning.    Marland Kitchen levothyroxine (SYNTHROID, LEVOTHROID) 100 MCG tablet Take 100 mcg by mouth daily.    . predniSONE (DELTASONE) 10 MG tablet 12 day tapering dose 48 tablet 0  . rosuvastatin (CRESTOR) 5 MG tablet Take 5  mg by mouth 2 (two) times a week.    . venlafaxine XR (EFFEXOR-XR) 150 MG 24 hr capsule Take 150 mg by mouth daily.     No current facility-administered medications for this visit.    Current Outpatient Medications  Medication Sig Dispense Refill  . amLODipine (NORVASC) 5 MG tablet Take 5 mg by mouth daily.    . budesonide-formoterol (SYMBICORT) 160-4.5 MCG/ACT inhaler Inhale 2 puffs into the lungs 2 (two) times daily.    . calcium-vitamin D (OSCAL WITH D) 500-200 MG-UNIT per tablet Take 1 tablet by mouth daily.    . Esomeprazole Magnesium (NEXIUM PO) Take by mouth.    . estradiol (ESTRACE) 2 MG tablet Take 2 mg by mouth daily.     . hydroxychloroquine (PLAQUENIL) 200 MG tablet     . levETIRAcetam (KEPPRA) 500 MG tablet Take 500 mg by mouth every morning.    Marland Kitchen levothyroxine (SYNTHROID, LEVOTHROID) 100 MCG tablet Take 100 mcg by mouth daily.    . predniSONE (DELTASONE) 10 MG tablet 12 day tapering dose 48 tablet 0  . rosuvastatin (CRESTOR) 5 MG tablet Take 5 mg by mouth 2 (two) times a week.    . venlafaxine XR (EFFEXOR-XR) 150 MG 24 hr capsule Take 150 mg by mouth daily.     No current facility-administered medications for this visit.    Patient Active Problem List   Diagnosis Date Noted  . Achilles tendon rupture, right, initial encounter 09/18/2020  . Degeneration of lumbar intervertebral disc 08/01/2018  . Lumbar radiculopathy 08/01/2018   Past Medical History:  Diagnosis Date  . Anxiety   . Arthritis   . Asthma   . Depression   . GERD (gastroesophageal reflux disease)   . Headache(784.0)   . Hypertension   . Hypothyroidism   . PONV (postoperative nausea and vomiting)   . Wears glasses     History reviewed. No pertinent family history.  Past Surgical History:  Procedure Laterality Date  . ABDOMINAL HYSTERECTOMY  1981  . APPENDECTOMY     age 45  . CHOLECYSTECTOMY  1/10   lap choli  . COLONOSCOPY    . CYSTOCELE REPAIR  8/10   ant/post-rectocele/cystocele repair  . OVARY SURGERY  1995  . SHOULDER ARTHROSCOPY WITH ROTATOR CUFF REPAIR AND SUBACROMIAL DECOMPRESSION  12/12/2012   Procedure: SHOULDER ARTHROSCOPY WITH ROTATOR CUFF REPAIR AND SUBACROMIAL DECOMPRESSION;  Surgeon: Garald Balding, MD;  Location: [facility];  Service: Orthopedics;  Laterality: Right;  Right Shoulder Mini Open Rotator Cuff Repair and Subacromial Decompression, possible Patch.  . TONSILLECTOMY     age 68  . TUBAL LIGATION  1976   Social History   Occupational History  . Not on file  Tobacco Use  . Smoking status: Former Smoker    Quit date: 12/07/1988    Years since quitting: 31.8  Substance and Sexual  Activity  . Alcohol use: Yes    Comment: occ  . Drug use: Not on file  . Sexual activity: Not on file

## 2020-10-02 ENCOUNTER — Telehealth: Payer: Self-pay | Admitting: Podiatry

## 2020-10-02 NOTE — Telephone Encounter (Signed)
[facility] called stating they needed the plan of care signed. I called them back, and had them re-fax it to me.

## 2020-10-07 ENCOUNTER — Ambulatory Visit: Payer: Medicare HMO | Admitting: Orthopedic Surgery

## 2020-10-07 ENCOUNTER — Encounter: Payer: Self-pay | Admitting: Orthopedic Surgery

## 2020-10-07 ENCOUNTER — Other Ambulatory Visit: Payer: Self-pay | Admitting: Physician Assistant

## 2020-10-07 VITALS — Ht 63.0 in | Wt 146.0 lb

## 2020-10-07 DIAGNOSIS — S86019A Strain of unspecified Achilles tendon, initial encounter: Secondary | ICD-10-CM

## 2020-10-07 NOTE — Progress Notes (Signed)
Office Visit Note   Patient: [name]           Date of Birth: 1946/05/07           MRN: [id number] Visit Date: 10/07/2020              Requested by: Garald Balding, MD [address] [place],  [place] [address] PCP: Kathyrn Lass, MD  Chief Complaint  Patient presents with   Right Ankle - Pain    Referral from Dr. Durward Fortes for peroneus brevis tear       HPI: Patient is a 74 year old woman who presents with a chronic rupture of her right Achilles tendon.  She denies any trauma she states that the symptoms started in May she states that she has been followed by her podiatrist who has treated her with a fracture boot physical therapy and radiographs.  She is unclear of the diagnosis.  Patient recently saw Dr. Durward Fortes MRI scan was obtained and patient is seen today for initial evaluation and treatment.  Patient has a history of rheumatoid arthritis she is on Plaquenil she has a history of migraine headaches and she states that she has taken a course of prednisone for the acute symptoms but is not taking it now.  She also takes Plaquenil.  Assessment & Plan: Visit Diagnoses:  1. Chronic rupture of Achilles tendon     Plan: Discussed that she has a significantly retracted Achilles tendon chronic rupture that we would need to proceed with a secondary repair of the Achilles and then which may require fasciotomies or fascial turndown to allow for the repair.  Discussed that she has an increased risk of infection neurovascular injury nonhealing of the incision need for additional surgery due to the chronic nature of the rupture and due to her rheumatoid arthritis.  Patient states she understands and wishes to proceed at this time.  Follow-Up Instructions: Return in about 2 weeks (around 10/21/2020).   Ortho Exam  Patient is alert, oriented, no adenopathy, well-dressed, normal affect, normal respiratory effort. Examination patient has a strong dorsalis pedis pulse she has good  ankle good subtalar motion compression of the calf does not reproduce any plantarflexion of the foot on the right she has good plantar flexion on the left.  There is a palpable defect of the Achilles tendon with approximately 3 cm retraction bimanual exam.  Review of the MRI scan shows a 3.6 cm retraction of the Achilles tendon.  There is also a longitudinal split tear of the peroneus brevis.  Patient has no tenderness to palpation over the peroneal tendons.  Imaging: No results found. No images are attached to the encounter.  Labs: No results found for: HGBA1C, ESRSEDRATE, CRP, LABURIC, REPTSTATUS, GRAMSTAIN, CULT, LABORGA   Lab Results  Component Value Date   ALBUMIN 3.7 12/11/2012   ALBUMIN 3.8 12/10/2008    No results found for: MG No results found for: VD25OH  No results found for: PREALBUMIN CBC EXTENDED Latest Ref Rng & Units 12/11/2012 07/03/2009 07/02/2009  WBC 4.0 - 10.5 K/uL 6.6 9.6 8.6  RBC 3.87 - 5.11 MIL/uL 4.44 3.22(L) 3.27(L)  HGB 12.0 - 15.0 g/dL 13.0 10.4(L) 10.4 DELTA CHECK NOTED(L)  HCT 36 - 46 % 38.2 29.3(L) 30.0(L)  PLT 150 - 400 K/uL 299 252 247 DELTA CHECK NOTED  NEUTROABS 1.7 - 7.7 K/uL - - -  LYMPHSABS 0.7 - 4.0 K/uL - - -     Body mass index is 25.86 kg/m.  Orders:  No orders of the defined types were placed in this encounter.  No orders of the defined types were placed in this encounter.    Procedures: No procedures performed  Clinical Data: No additional findings.  ROS:  All other systems negative, except as noted in the HPI. Review of Systems  Objective: Vital Signs: Ht 5\' 3"  (1.6 m)    Wt 146 lb (66.2 kg)    BMI 25.86 kg/m   Specialty Comments:  No specialty comments available.  PMFS History: Patient Active Problem List   Diagnosis Date Noted   Achilles tendon rupture, right, initial encounter 09/18/2020   Degeneration of lumbar intervertebral disc 08/01/2018   Lumbar radiculopathy 08/01/2018   Past Medical History:    Diagnosis Date   Anxiety    Arthritis    Asthma    Depression    GERD (gastroesophageal reflux disease)    Headache(784.0)    Hypertension    Hypothyroidism    PONV (postoperative nausea and vomiting)    Wears glasses     History reviewed. No pertinent family history.  Past Surgical History:  Procedure Laterality Date   ABDOMINAL HYSTERECTOMY  58   APPENDECTOMY     age 49   CHOLECYSTECTOMY  1/10   lap choli   COLONOSCOPY     CYSTOCELE REPAIR  8/10   ant/post-rectocele/cystocele repair   [place] ARTHROSCOPY WITH ROTATOR CUFF REPAIR AND SUBACROMIAL DECOMPRESSION  12/12/2012   Procedure: SHOULDER ARTHROSCOPY WITH ROTATOR CUFF REPAIR AND SUBACROMIAL DECOMPRESSION;  Surgeon: Garald Balding, MD;  Location: [facility];  Service: Orthopedics;  Laterality: Right;  Right Shoulder Mini Open Rotator Cuff Repair and Subacromial Decompression, possible Patch.   TONSILLECTOMY     age 68   [place]   Social History   Occupational History   Not on file  Tobacco Use   Smoking status: Former Smoker    Quit date: 12/07/1988    Years since quitting: 31.8  Substance and Sexual Activity   Alcohol use: Yes    Comment: occ   Drug use: Not on file   Sexual activity: Not on file

## 2020-10-08 ENCOUNTER — Other Ambulatory Visit: Payer: Self-pay

## 2020-10-08 ENCOUNTER — Other Ambulatory Visit (HOSPITAL_COMMUNITY): Payer: Medicare HMO

## 2020-10-09 ENCOUNTER — Other Ambulatory Visit (HOSPITAL_COMMUNITY)
Admission: RE | Admit: 2020-10-09 | Discharge: 2020-10-09 | Disposition: A | Discharge status: Home / Self Care | Payer: Medicare HMO | Source: Ambulatory Visit | Attending: Orthopedic Surgery | Admitting: Orthopedic Surgery

## 2020-10-09 ENCOUNTER — Other Ambulatory Visit: Payer: Self-pay

## 2020-10-09 ENCOUNTER — Encounter (HOSPITAL_COMMUNITY): Payer: Self-pay | Admitting: Orthopedic Surgery

## 2020-10-09 ENCOUNTER — Telehealth: Payer: Self-pay | Admitting: Orthopedic Surgery

## 2020-10-09 DIAGNOSIS — Z01812 Encounter for preprocedural laboratory examination: Secondary | ICD-10-CM | POA: Insufficient documentation

## 2020-10-09 DIAGNOSIS — Z20822 Contact with and (suspected) exposure to covid-19: Secondary | ICD-10-CM | POA: Insufficient documentation

## 2020-10-09 DIAGNOSIS — J45909 Unspecified asthma, uncomplicated: Secondary | ICD-10-CM | POA: Diagnosis not present

## 2020-10-09 DIAGNOSIS — F329 Major depressive disorder, single episode, unspecified: Secondary | ICD-10-CM | POA: Diagnosis not present

## 2020-10-09 DIAGNOSIS — M1990 Unspecified osteoarthritis, unspecified site: Secondary | ICD-10-CM | POA: Diagnosis not present

## 2020-10-09 DIAGNOSIS — J449 Chronic obstructive pulmonary disease, unspecified: Secondary | ICD-10-CM | POA: Diagnosis not present

## 2020-10-09 DIAGNOSIS — J4540 Moderate persistent asthma, uncomplicated: Secondary | ICD-10-CM | POA: Diagnosis not present

## 2020-10-09 DIAGNOSIS — K219 Gastro-esophageal reflux disease without esophagitis: Secondary | ICD-10-CM | POA: Diagnosis not present

## 2020-10-09 DIAGNOSIS — E7800 Pure hypercholesterolemia, unspecified: Secondary | ICD-10-CM | POA: Diagnosis not present

## 2020-10-09 DIAGNOSIS — E039 Hypothyroidism, unspecified: Secondary | ICD-10-CM | POA: Diagnosis not present

## 2020-10-09 DIAGNOSIS — I10 Essential (primary) hypertension: Secondary | ICD-10-CM | POA: Diagnosis not present

## 2020-10-09 LAB — SARS CORONAVIRUS 2 (TAT 6-24 HRS): SARS Coronavirus 2: NEGATIVE

## 2020-10-09 NOTE — Progress Notes (Signed)
PCP - Dr. Sabra Heck [facility])  Cardiologist - denies  Chest x-ray - n/a EKG - DOS Stress Test - denies ECHO - denies Cardiac Cath - denies  ERAS Protcol - clears until 0900  COVID TEST- 11/11  Anesthesia review: n/a   -------------  SDW INSTRUCTIONS:  Your procedure is scheduled on 10/10/20.  Report to [facility] Main Entrance "A" at 0930 A.M., and check in at the Admitting office.  Call this number if you have problems the morning of surgery: [phone]   Remember: Do not eat after midnight the night before your surgery  You may drink clear liquids until 0900 the morning of your surgery.   Clear liquids allowed are: Water, Non-Citrus Juices (without pulp), Carbonated Beverages, Clear Tea, Black Coffee Only, and Gatorade   Take these medicines the morning of surgery with A SIP OF WATER: amLODipine (NORVASC)  esomeprazole (NEXIUM)  hydroxychloroquine (PLAQUENIL) levETIRAcetam (KEPPRA)  levothyroxine (SYNTHROID, LEVOTHROID) venlafaxine XR (EFFEXOR-XR)  As of today, STOP taking any meloxicam (MOBIC), Aspirin (unless otherwise instructed by your surgeon), Aleve, Naproxen, Ibuprofen, Motrin, Advil, Goody's, BC's, all herbal medications, fish oil, and all vitamins.    The Morning of Surgery  Do not wear jewelry, make-up or nail polish.  Do not wear lotions, powders, or perfumes, or deodorant  Do not shave 48 hours prior to surgery.    Do not bring valuables to the hospital.  [facility] is not responsible for any belongings or valuables.  If you are a smoker, DO NOT Smoke 24 hours prior to surgery  If you wear a CPAP at night please bring your mask the morning of surgery   Remember that you must have someone to transport you home after your surgery, and remain with you for 24 hours if you are discharged the same day.  Please bring cases for contacts, glasses, hearing aids, dentures or bridgework because it cannot be worn into surgery.   Leave your  suitcase in the car.  After surgery it may be brought to your room.  For patients admitted to the hospital, discharge time will be determined by your treatment team.  Patients discharged the day of surgery will not be allowed to drive home.    Special instructions:   [facility]- Preparing For Surgery  Oral Hygiene is also important to reduce your risk of infection.  Remember - BRUSH YOUR TEETH THE MORNING OF SURGERY WITH YOUR REGULAR TOOTHPASTE  Please follow these instructions carefully.   1. Shower the NIGHT BEFORE SURGERY and the MORNING OF SURGERY with DIAL Soap.   2. Wash thoroughly, paying special attention to the area where your surgery will be performed.  3. Thoroughly rinse your body with warm water from the neck down.  4. Pat yourself dry with a CLEAN TOWEL.  5. Wear CLEAN PAJAMAS to bed the night before surgery  6. Place CLEAN SHEETS on your bed the night of your first shower and DO NOT SLEEP WITH PETS.  7. Wear comfortable clothes the morning of surgery.    Day of Surgery:  Please shower the morning of surgery with the DIAL soap Do not apply any deodorants/lotions. Please wear clean clothes to the hospital/surgery center.   Remember to brush your teeth WITH YOUR REGULAR TOOTHPASTE.   Please read over the following fact sheets that you were given.  Patient denies shortness of breath, fever, cough and chest pain.

## 2020-10-10 ENCOUNTER — Ambulatory Visit (HOSPITAL_COMMUNITY): Payer: Medicare HMO | Admitting: Anesthesiology

## 2020-10-10 ENCOUNTER — Encounter (HOSPITAL_COMMUNITY)
Admission: RE | Disposition: A | Discharge status: Home / Self Care | Payer: Self-pay | Source: Home / Self Care | Attending: Orthopedic Surgery

## 2020-10-10 ENCOUNTER — Encounter (HOSPITAL_COMMUNITY): Payer: Self-pay | Admitting: Orthopedic Surgery

## 2020-10-10 ENCOUNTER — Ambulatory Visit (HOSPITAL_COMMUNITY)
Admission: RE | Admit: 2020-10-10 | Discharge: 2020-10-10 | Disposition: A | Discharge status: Home / Self Care | Payer: Medicare HMO | Attending: Orthopedic Surgery | Admitting: Orthopedic Surgery

## 2020-10-10 DIAGNOSIS — S86011A Strain of right Achilles tendon, initial encounter: Secondary | ICD-10-CM

## 2020-10-10 DIAGNOSIS — G8918 Other acute postprocedural pain: Secondary | ICD-10-CM | POA: Diagnosis not present

## 2020-10-10 DIAGNOSIS — S86019A Strain of unspecified Achilles tendon, initial encounter: Secondary | ICD-10-CM

## 2020-10-10 DIAGNOSIS — Z888 Allergy status to other drugs, medicaments and biological substances status: Secondary | ICD-10-CM | POA: Insufficient documentation

## 2020-10-10 DIAGNOSIS — M5116 Intervertebral disc disorders with radiculopathy, lumbar region: Secondary | ICD-10-CM | POA: Diagnosis not present

## 2020-10-10 DIAGNOSIS — M069 Rheumatoid arthritis, unspecified: Secondary | ICD-10-CM | POA: Diagnosis not present

## 2020-10-10 DIAGNOSIS — Z881 Allergy status to other antibiotic agents status: Secondary | ICD-10-CM | POA: Insufficient documentation

## 2020-10-10 DIAGNOSIS — M66861 Spontaneous rupture of other tendons, right lower leg: Secondary | ICD-10-CM | POA: Insufficient documentation

## 2020-10-10 DIAGNOSIS — Z87891 Personal history of nicotine dependence: Secondary | ICD-10-CM | POA: Insufficient documentation

## 2020-10-10 DIAGNOSIS — E039 Hypothyroidism, unspecified: Secondary | ICD-10-CM | POA: Diagnosis not present

## 2020-10-10 DIAGNOSIS — M66871 Spontaneous rupture of other tendons, right ankle and foot: Secondary | ICD-10-CM | POA: Diagnosis not present

## 2020-10-10 HISTORY — PX: ACHILLES TENDON SURGERY: SHX542

## 2020-10-10 LAB — BASIC METABOLIC PANEL
Anion gap: 13 (ref 5–15)
BUN: 15 mg/dL (ref 8–23)
CO2: 23 mmol/L (ref 22–32)
Calcium: 9.5 mg/dL (ref 8.9–10.3)
Chloride: 102 mmol/L (ref 98–111)
Creatinine, Ser: 1.2 mg/dL — ABNORMAL HIGH (ref 0.44–1.00)
GFR, Estimated: 48 mL/min — ABNORMAL LOW (ref >60)
Glucose, Bld: 93 mg/dL (ref 70–99)
Potassium: 3.3 mmol/L — ABNORMAL LOW (ref 3.5–5.1)
Sodium: 138 mmol/L (ref 135–145)

## 2020-10-10 LAB — CBC
HCT: 37.7 % (ref 36.0–46.0)
Hemoglobin: 12.8 g/dL (ref 12.0–15.0)
MCH: 29.2 pg (ref 26.0–34.0)
MCHC: 34 g/dL (ref 30.0–36.0)
MCV: 86.1 fL (ref 80.0–100.0)
Platelets: 298 10*3/uL (ref 150–400)
RBC: 4.38 MIL/uL (ref 3.87–5.11)
RDW: 11.8 % (ref 11.5–15.5)
WBC: 6.4 10*3/uL (ref 4.0–10.5)
nRBC: 0 % (ref 0.0–0.2)

## 2020-10-10 SURGERY — REPAIR, TENDON, ACHILLES
Anesthesia: General | Site: Ankle | Laterality: Right

## 2020-10-10 MED ORDER — OXYCODONE HCL 5 MG PO TABS
5.0000 mg | ORAL_TABLET | Freq: Once | ORAL | Status: AC | PRN
  Administered 2020-10-10: 5 mg via ORAL

## 2020-10-10 MED ORDER — CHLORHEXIDINE GLUCONATE 0.12 % MT SOLN
OROMUCOSAL | Status: AC
  Administered 2020-10-10: 15 mL via OROMUCOSAL
  Filled 2020-10-10: qty 15

## 2020-10-10 MED ORDER — OXYCODONE-ACETAMINOPHEN 5-325 MG PO TABS: 1.0000 | ORAL_TABLET | ORAL | 0 refills | Status: DC | PRN

## 2020-10-10 MED ORDER — FENTANYL CITRATE (PF) 100 MCG/2ML IJ SOLN
INTRAMUSCULAR | Status: AC
  Filled 2020-10-10: qty 2

## 2020-10-10 MED ORDER — ACETAMINOPHEN 160 MG/5ML PO SOLN: 325.0000 mg | ORAL | Status: DC | PRN

## 2020-10-10 MED ORDER — DIPHENHYDRAMINE HCL 50 MG/ML IJ SOLN
INTRAMUSCULAR | Status: DC | PRN
  Administered 2020-10-10: 6.25 mg via INTRAVENOUS

## 2020-10-10 MED ORDER — PROPOFOL 10 MG/ML IV BOLUS
INTRAVENOUS | Status: AC
  Filled 2020-10-10: qty 20

## 2020-10-10 MED ORDER — BUPIVACAINE-EPINEPHRINE (PF) 0.5% -1:200000 IJ SOLN
INTRAMUSCULAR | Status: DC | PRN
  Administered 2020-10-10: 15 mL via PERINEURAL

## 2020-10-10 MED ORDER — PROPOFOL 10 MG/ML IV BOLUS
INTRAVENOUS | Status: DC | PRN
  Administered 2020-10-10: 100 mg via INTRAVENOUS

## 2020-10-10 MED ORDER — ONDANSETRON HCL 4 MG/2ML IJ SOLN: 4.0000 mg | Freq: Once | INTRAMUSCULAR | Status: DC | PRN

## 2020-10-10 MED ORDER — MEPERIDINE HCL 25 MG/ML IJ SOLN: 6.2500 mg | INTRAMUSCULAR | Status: DC | PRN

## 2020-10-10 MED ORDER — LACTATED RINGERS IV SOLN: INTRAVENOUS | Status: DC

## 2020-10-10 MED ORDER — MIDAZOLAM HCL 2 MG/2ML IJ SOLN
INTRAMUSCULAR | Status: AC
  Administered 2020-10-10: 2 mg via INTRAVENOUS
  Filled 2020-10-10: qty 2

## 2020-10-10 MED ORDER — ONDANSETRON HCL 4 MG/2ML IJ SOLN
INTRAMUSCULAR | Status: AC
  Filled 2020-10-10: qty 2

## 2020-10-10 MED ORDER — ONDANSETRON HCL 4 MG/2ML IJ SOLN
INTRAMUSCULAR | Status: DC | PRN
  Administered 2020-10-10: 4 mg via INTRAVENOUS

## 2020-10-10 MED ORDER — MIDAZOLAM HCL 2 MG/2ML IJ SOLN: 2.0000 mg | Freq: Once | INTRAMUSCULAR | Status: AC

## 2020-10-10 MED ORDER — CHLORHEXIDINE GLUCONATE 0.12 % MT SOLN: 15.0000 mL | Freq: Once | OROMUCOSAL | Status: AC

## 2020-10-10 MED ORDER — FENTANYL CITRATE (PF) 100 MCG/2ML IJ SOLN
INTRAMUSCULAR | Status: AC
  Administered 2020-10-10: 100 ug via INTRAVENOUS
  Filled 2020-10-10: qty 2

## 2020-10-10 MED ORDER — DIPHENHYDRAMINE HCL 50 MG/ML IJ SOLN
INTRAMUSCULAR | Status: AC
  Filled 2020-10-10: qty 1

## 2020-10-10 MED ORDER — FENTANYL CITRATE (PF) 100 MCG/2ML IJ SOLN: 100.0000 ug | Freq: Once | INTRAMUSCULAR | Status: AC

## 2020-10-10 MED ORDER — BUPIVACAINE LIPOSOME 1.3 % IJ SUSP
INTRAMUSCULAR | Status: DC | PRN
  Administered 2020-10-10: 10 mL via PERINEURAL

## 2020-10-10 MED ORDER — CEFAZOLIN SODIUM-DEXTROSE 2-4 GM/100ML-% IV SOLN
2.0000 g | INTRAVENOUS | Status: AC
  Administered 2020-10-10: 2 g via INTRAVENOUS
  Filled 2020-10-10: qty 100

## 2020-10-10 MED ORDER — PHENYLEPHRINE 40 MCG/ML (10ML) SYRINGE FOR IV PUSH (FOR BLOOD PRESSURE SUPPORT)
PREFILLED_SYRINGE | INTRAVENOUS | Status: DC | PRN
  Administered 2020-10-10 (×3): 80 ug via INTRAVENOUS

## 2020-10-10 MED ORDER — ACETAMINOPHEN 325 MG PO TABS: 325.0000 mg | ORAL_TABLET | ORAL | Status: DC | PRN

## 2020-10-10 MED ORDER — OXYCODONE HCL 5 MG PO TABS
ORAL_TABLET | ORAL | Status: AC
  Filled 2020-10-10: qty 1

## 2020-10-10 MED ORDER — FENTANYL CITRATE (PF) 100 MCG/2ML IJ SOLN
25.0000 ug | INTRAMUSCULAR | Status: DC | PRN
  Administered 2020-10-10 (×4): 25 ug via INTRAVENOUS

## 2020-10-10 MED ORDER — PHENYLEPHRINE 40 MCG/ML (10ML) SYRINGE FOR IV PUSH (FOR BLOOD PRESSURE SUPPORT)
PREFILLED_SYRINGE | INTRAVENOUS | Status: AC
  Filled 2020-10-10: qty 10

## 2020-10-10 MED ORDER — LIDOCAINE 2% (20 MG/ML) 5 ML SYRINGE
INTRAMUSCULAR | Status: DC | PRN
  Administered 2020-10-10: 60 mg via INTRAVENOUS

## 2020-10-10 MED ORDER — DEXAMETHASONE SODIUM PHOSPHATE 10 MG/ML IJ SOLN
INTRAMUSCULAR | Status: AC
  Filled 2020-10-10: qty 1

## 2020-10-10 MED ORDER — LIDOCAINE 2% (20 MG/ML) 5 ML SYRINGE
INTRAMUSCULAR | Status: AC
  Filled 2020-10-10: qty 5

## 2020-10-10 MED ORDER — OXYCODONE HCL 5 MG/5ML PO SOLN: 5.0000 mg | Freq: Once | ORAL | Status: AC | PRN

## 2020-10-10 MED ORDER — DEXAMETHASONE SODIUM PHOSPHATE 10 MG/ML IJ SOLN
INTRAMUSCULAR | Status: DC | PRN
  Administered 2020-10-10: 5 mg via INTRAVENOUS

## 2020-10-10 MED ORDER — ORAL CARE MOUTH RINSE: 15.0000 mL | Freq: Once | OROMUCOSAL | Status: AC

## 2020-10-10 MED ORDER — 0.9 % SODIUM CHLORIDE (POUR BTL) OPTIME
TOPICAL | Status: DC | PRN
  Administered 2020-10-10: 1000 mL

## 2020-10-10 SURGICAL SUPPLY — 38 items
ANCH SUT SWLK 19.1X4.75 (Anchor) IMPLANT
ANCH SUT SWLK 19.1X5.5 CLS EL (Anchor) ×1 IMPLANT
ANCHOR PEEK SWIVEL LOCK 5.5 (Anchor) ×2 IMPLANT
ANCHOR SUT BIO SW 4.75X19.1 (Anchor) IMPLANT
BNDG CMPR 9X4 STRL LF SNTH (GAUZE/BANDAGES/DRESSINGS) ×1
BNDG COHESIVE 6X5 TAN NS LF (GAUZE/BANDAGES/DRESSINGS) ×2 IMPLANT
BNDG COHESIVE 6X5 TAN STRL LF (GAUZE/BANDAGES/DRESSINGS) ×6 IMPLANT
BNDG ESMARK 4X9 LF (GAUZE/BANDAGES/DRESSINGS) ×2 IMPLANT
COVER SURGICAL LIGHT HANDLE (MISCELLANEOUS) ×6 IMPLANT
COVER WAND RF STERILE (DRAPES) ×3 IMPLANT
DRAPE INCISE IOBAN 66X45 STRL (DRAPES) ×2 IMPLANT
DRAPE U-SHAPE 47X51 STRL (DRAPES) ×3 IMPLANT
DRSG ADAPTIC 3X8 NADH LF (GAUZE/BANDAGES/DRESSINGS) ×3 IMPLANT
DURAPREP 26ML APPLICATOR (WOUND CARE) ×3 IMPLANT
ELECT REM PT RETURN 9FT ADLT (ELECTROSURGICAL) ×3
ELECTRODE REM PT RTRN 9FT ADLT (ELECTROSURGICAL) ×1 IMPLANT
GLOVE BIOGEL PI IND STRL 9 (GLOVE) ×1 IMPLANT
GLOVE BIOGEL PI INDICATOR 9 (GLOVE) ×2
GLOVE SURG ORTHO 9.0 STRL STRW (GLOVE) ×3 IMPLANT
GOWN STRL REUS W/ TWL XL LVL3 (GOWN DISPOSABLE) ×3 IMPLANT
GOWN STRL REUS W/TWL XL LVL3 (GOWN DISPOSABLE) ×9
KIT TURNOVER KIT B (KITS) ×3 IMPLANT
NDL SUT .5 MAYO 1.404X.05X (NEEDLE) ×1 IMPLANT
NEEDLE MAYO TAPER (NEEDLE) ×3
NS IRRIG 1000ML POUR BTL (IV SOLUTION) ×3 IMPLANT
PACK ORTHO EXTREMITY (CUSTOM PROCEDURE TRAY) ×3 IMPLANT
PAD ARMBOARD 7.5X6 YLW CONV (MISCELLANEOUS) ×6 IMPLANT
PREVENA RESTOR AXIOFORM 29X28 (GAUZE/BANDAGES/DRESSINGS) ×2 IMPLANT
SPONGE LAP 18X18 RF (DISPOSABLE) ×3 IMPLANT
SUT ETHILON 2 0 PSLX (SUTURE) ×6 IMPLANT
SUT FIBERWIRE #2 38 T-5 BLUE (SUTURE) ×6
SUTURE FIBERWR #2 38 T-5 BLUE (SUTURE) ×2 IMPLANT
TOWEL GREEN STERILE (TOWEL DISPOSABLE) ×3 IMPLANT
TOWEL GREEN STERILE FF (TOWEL DISPOSABLE) ×3 IMPLANT
TUBE CONNECTING 12'X1/4 (SUCTIONS) ×1
TUBE CONNECTING 12X1/4 (SUCTIONS) ×2 IMPLANT
WATER STERILE IRR 1000ML POUR (IV SOLUTION) ×3 IMPLANT
YANKAUER SUCT BULB TIP NO VENT (SUCTIONS) ×3 IMPLANT

## 2020-10-10 NOTE — Op Note (Signed)
10/10/2020  12:32 PM  PATIENT:  [name]    PRE-OPERATIVE DIAGNOSIS:  Chronic Rupture Right Achilles  POST-OPERATIVE DIAGNOSIS:  Same  PROCEDURE:  SECONDARY RECONSTRUCTION RIGHT ACHILLES  SURGEON:  Newt Minion, MD  PHYSICIAN ASSISTANT:None ANESTHESIA:   General  PREOPERATIVE INDICATIONS:  [name] is a  74 y.o. female with a diagnosis of Chronic Rupture Right Achilles who failed conservative measures and elected for surgical management.    The risks benefits and alternatives were discussed with the patient preoperatively including but not limited to the risks of infection, bleeding, nerve injury, cardiopulmonary complications, the need for revision surgery, among others, and the patient was willing to proceed.  OPERATIVE IMPLANTS: Arthrex 5.5 mm anchor  @ENCIMAGES @  OPERATIVE FINDINGS: Chronic degeneration and retraction of the Achilles tendon with very thin soft tissue envelope  OPERATIVE PROCEDURE: Patient brought the operating room and underwent a general anesthetic. After adequate levels anesthesia were obtained patient's right lower extremity was prepped using DuraPrep draped into a sterile field a timeout was called. A incision was made posterior medially along the Achilles tendon this was carried down through the retinaculum. Patient had retraction and degenerative changes of the residual Achilles tendon there was still bone attached to the end of the tendon. The distal half of a centimeter of the tendon was resected. The bone was freshened. Using Krakw suture technique to #1 Vicryl sutures were woven through the proximal stump with 4 sutures exiting distally. The tendon was advanced and the sutures were placed through the absorbable anchor. The anchor was inserted and this was reinforced with the nonabsorbable sutures from the anchor. This had a good secure fit. The wound was irrigated with normal saline and using a Allgower Donati suture technique the skin was  closed. The tissue envelope was extremely thin and there is definitely a concern for skin healing. A Praveena axial form wound VAC was applied this was covered and Ioban and a posterior splint with a sugar tong splint was applied over web roll. The wound VAC had a good suction fit patient was extubated taken the PACU in stable condition the foot was in plantar flexion.   DISCHARGE PLANNING:  Antibiotic duration: Preoperative antibiotics  Weightbearing: Touchdown weightbearing on the right  Pain medication: Prescription for Percocet  Dressing care/ Wound VAC: Continue wound VAC for 1 week  Ambulatory devices: Crutches or walker  Discharge to: Home.  Follow-up: In the office 1 week post operative.

## 2020-10-10 NOTE — Anesthesia Procedure Notes (Addendum)
Anesthesia Regional Block: Popliteal block   Pre-Anesthetic Checklist: ,, timeout performed, Correct Patient, Correct Site, Correct Laterality, Correct Procedure, Correct Position, site marked, Risks and benefits discussed,  Surgical consent,  Pre-op evaluation,  At surgeon's request and post-op pain management  Laterality: Right  Prep: chloraprep       Needles:  Injection technique: Single-shot  Needle Type: Echogenic Stimulator Needle     Needle Length: 5cm  Needle Gauge: 22     Additional Needles:   Procedures:, nerve stimulator,,, ultrasound used (permanent image in chart),,,,  Narrative:  Start time: 10/10/2020 11:10 AM End time: 10/10/2020 11:20 AM Injection made incrementally with aspirations every 5 mL.  Performed by: Personally  Anesthesiologist: Janeece Riggers, MD  Additional Notes: Functioning IV was confirmed and monitors were applied.  A 48mm 22ga Arrow echogenic stimulator needle was used. Sterile prep and drape,hand hygiene and sterile gloves were used. Ultrasound guidance: relevant anatomy identified, needle position confirmed, local anesthetic spread visualized around nerve(s)., vascular puncture avoided.  Image printed for medical record. Negative aspiration and negative test dose prior to incremental administration of local anesthetic. The patient tolerated the procedure well.

## 2020-10-10 NOTE — Anesthesia Postprocedure Evaluation (Signed)
Anesthesia Post Note  Patient: [name]  Procedure(s) Performed: SECONDARY RECONSTRUCTION RIGHT ACHILLES (Right Ankle)     Patient location during evaluation: PACU Anesthesia Type: General Level of consciousness: awake and alert Pain management: pain level controlled Vital Signs Assessment: post-procedure vital signs reviewed and stable Respiratory status: spontaneous breathing, nonlabored ventilation and respiratory function stable Cardiovascular status: blood pressure returned to baseline and stable Postop Assessment: no apparent nausea or vomiting Anesthetic complications: no   No complications documented.  Last Vitals:  Vitals:   10/10/20 1351 10/10/20 1441  BP: 110/84   Pulse: 82   Resp: 14   Temp:  (!) 36.1 C  SpO2: 93%     Last Pain:  Vitals:   10/10/20 1351  TempSrc:   PainSc: [place]

## 2020-10-10 NOTE — Anesthesia Preprocedure Evaluation (Addendum)
Anesthesia Evaluation  Patient identified by MRN, date of birth, ID band Patient awake    Reviewed: Allergy & Precautions, H&P , NPO status , Patient's Chart, lab work & pertinent test results  History of Anesthesia Complications (+) PONV and history of anesthetic complications  Airway Mallampati: II  TM Distance: >3 FB Neck ROM: Full    Dental no notable dental hx. (+) Teeth Intact, Dental Advisory Given   Pulmonary asthma , former smoker,    Pulmonary exam normal breath sounds clear to auscultation       Cardiovascular hypertension, Pt. on medications Normal cardiovascular exam Rhythm:Regular Rate:Normal     Neuro/Psych  Headaches, PSYCHIATRIC DISORDERS Anxiety Depression  Neuromuscular disease    GI/Hepatic GERD  Medicated and Controlled,  Endo/Other  Hypothyroidism   Renal/GU      Musculoskeletal   Abdominal   Peds  Hematology   Anesthesia Other Findings   Reproductive/Obstetrics                            Anesthesia Physical  Anesthesia Plan  ASA: III  Anesthesia Plan: General   Post-op Pain Management: GA combined w/ Regional for post-op pain   Induction: Intravenous  PONV Risk Score and Plan: 4 or greater and Ondansetron, Treatment may vary due to age or medical condition and Scopolamine patch - Pre-op  Airway Management Planned: Oral ETT and LMA  Additional Equipment:   Intra-op Plan:   Post-operative Plan: Extubation in OR  Informed Consent: I have reviewed the patients History and Physical, chart, labs and discussed the procedure including the risks, benefits and alternatives for the proposed anesthesia with the patient or authorized representative who has indicated his/her understanding and acceptance.     Dental advisory given  Plan Discussed with: CRNA, Anesthesiologist and Surgeon  Anesthesia Plan Comments: (Discussed both nerve block for pain relief  post-op and GA; including NV, sore throat, dental injury, and pulmonary complications)        Anesthesia Quick Evaluation

## 2020-10-10 NOTE — Progress Notes (Signed)
Orthopedic Tech Progress Note Patient Details:  [name] November 24, 1946 [id number] Was called to PACU for a PAIR OF CRUTCHES. Patient refused them stating" MD said she could have a knee scooter or crutches" so she ordered to the SCOOTER.  Patient ID: [name], female   DOB: 06-02-1946, 74 y.o.   MRN: [id number]   Janit Pagan 10/10/2020, 3:16 PM

## 2020-10-10 NOTE — Interval H&P Note (Signed)
History and Physical Interval Note:  10/10/2020 10:10 AM  [name]  has presented today for surgery, with the diagnosis of Chronic Rupture Right Achilles.  The various methods of treatment have been discussed with the patient and family. After consideration of risks, benefits and other options for treatment, the patient has consented to  Procedure(s): SECONDARY RECONSTRUCTION RIGHT ACHILLES (Right) as a surgical intervention.  The patient's history has been reviewed, patient examined, no change in status, stable for surgery.  I have reviewed the patient's chart and labs.  Questions were answered to the patient's satisfaction.     Newt Minion

## 2020-10-10 NOTE — Anesthesia Procedure Notes (Signed)
Procedure Name: LMA Insertion Date/Time: 10/10/2020 11:30 AM Performed by: Gwyndolyn Saxon, CRNA Pre-anesthesia Checklist: Patient identified, Emergency Drugs available, Suction available and Patient being monitored Patient Re-evaluated:Patient Re-evaluated prior to induction Oxygen Delivery Method: Circle system utilized Preoxygenation: Pre-oxygenation with 100% oxygen Induction Type: IV induction LMA: LMA inserted LMA Size: 4.0 Number of attempts: 1 Placement Confirmation: positive ETCO2 and breath sounds checked- equal and bilateral Tube secured with: Tape Dental Injury: Teeth and Oropharynx as per pre-operative assessment

## 2020-10-10 NOTE — H&P (Signed)
[name] is an 74 y.o. female.   Chief Complaint: Right Chronic achilles Rupture HPI: Patient is a 74 year old woman who presents with a chronic rupture of her right Achilles tendon.  She denies any trauma she states that the symptoms started in May she states that she has been followed by her podiatrist who has treated her with a fracture boot physical therapy and radiographs.  She is unclear of the diagnosis.  Patient recently saw Dr. Durward Fortes MRI scan was obtained and patient is seen today for initial evaluation and treatment.  Patient has a history of rheumatoid arthritis she is on Plaquenil she has a history of migraine headaches and she states that she has taken a course of prednisone for the acute symptoms but is not taking it now.  She also takes Plaquenil.  Past Medical History:  Diagnosis Date  . Anxiety   . Arthritis   . Asthma   . Depression   . GERD (gastroesophageal reflux disease)   . Headache(784.0)   . Hypertension   . Hypothyroidism   . PONV (postoperative nausea and vomiting)   . Wears glasses     Past Surgical History:  Procedure Laterality Date  . ABDOMINAL HYSTERECTOMY  1981  . APPENDECTOMY     age 74  . CHOLECYSTECTOMY  1/10   lap choli  . COLONOSCOPY    . CYSTOCELE REPAIR  8/10   ant/post-rectocele/cystocele repair  . OVARY SURGERY  1995  . SHOULDER ARTHROSCOPY WITH ROTATOR CUFF REPAIR AND SUBACROMIAL DECOMPRESSION  12/12/2012   Procedure: SHOULDER ARTHROSCOPY WITH ROTATOR CUFF REPAIR AND SUBACROMIAL DECOMPRESSION;  Surgeon: Garald Balding, MD;  Location: [facility];  Service: Orthopedics;  Laterality: Right;  Right Shoulder Mini Open Rotator Cuff Repair and Subacromial Decompression, possible Patch.  . TONSILLECTOMY     age 73  . TUBAL LIGATION  1976    History reviewed. No pertinent family history. Social History:  reports that she quit smoking about 31 years ago. She has never used smokeless tobacco. She reports current  alcohol use. She reports that she does not use drugs.  Allergies:  Allergies  Allergen Reactions  . Doxycycline Nausea And Vomiting  . Prednisone Nausea And Vomiting    In high strengths  . Cleocin [Clindamycin Hcl] Rash    No medications prior to admission.    Results for orders placed or performed during the hospital encounter of 10/09/20 (from the past 48 hour(s))  SARS CORONAVIRUS 2 (TAT 6-24 HRS) Nasopharyngeal Nasopharyngeal Swab     Status: None   Collection Time: 10/09/20 10:32 AM   Specimen: Nasopharyngeal Swab  Result Value Ref Range   SARS Coronavirus 2 NEGATIVE NEGATIVE    Comment: (NOTE) SARS-CoV-2 target nucleic acids are NOT DETECTED.  The SARS-CoV-2 RNA is generally detectable in upper and lower respiratory specimens during the acute phase of infection. Negative results do not preclude SARS-CoV-2 infection, do not rule out co-infections with other pathogens, and should not be used as the sole basis for treatment or other patient management decisions. Negative results must be combined with clinical observations, patient history, and epidemiological information. The expected result is Negative.  Fact Sheet for Patients: SugarRoll.be  Fact Sheet for Healthcare Providers: https://www.woods-mathews.com/  This test is not yet approved or cleared by the [place] FDA and  has been authorized for detection and/or diagnosis of SARS-CoV-2 by FDA under an Emergency Use Authorization (EUA). This EUA will remain  in effect (meaning this test can be used)  for the duration of the COVID-19 declaration under Se ction 564(b)(1) of the Act, 21 U.S.C. section 360bbb-3(b)(1), unless the authorization is terminated or revoked sooner.  Performed at [facility], [place] [address], [place], [place] [address]    No results found.  Review of Systems  All other systems reviewed and are negative.   Height 5\' 3"  (1.6 m), weight  64.9 kg. Physical Exam  Patient is alert, oriented, no adenopathy, well-dressed, normal affect, normal respiratory effort. Examination patient has a strong dorsalis pedis pulse she has good ankle good subtalar motion compression of the calf does not reproduce any plantarflexion of the foot on the right she has good plantar flexion on the left.  There is a palpable defect of the Achilles tendon with approximately 3 cm retraction bimanual exam.  Review of the MRI scan shows a 3.6 cm retraction of the Achilles tendon.  There is also a longitudinal split tear of the peroneus brevis.  Patient has no tenderness to palpation over the peroneal tendons.Heart RRR Lungs Clear Assessment/Plan  1. Chronic rupture of Achilles tendon     Plan: Discussed that she has a significantly retracted Achilles tendon chronic rupture that we would need to proceed with a secondary repair of the Achilles and then which may require fasciotomies or fascial turndown to allow for the repair.  Discussed that she has an increased risk of infection neurovascular injury nonhealing of the incision need for additional surgery due to the chronic nature of the rupture and due to her rheumatoid arthritis.  Patient states she understands and wishes to proceed at this time.  Bevely Palmer Elvie Palomo, PA 10/10/2020, 6:43 AM

## 2020-10-10 NOTE — Transfer of Care (Signed)
Immediate Anesthesia Transfer of Care Note  Patient: [name]  Procedure(s) Performed: SECONDARY RECONSTRUCTION RIGHT ACHILLES (Right Ankle)  Patient Location: PACU  Anesthesia Type:General and Regional  Level of Consciousness: drowsy and patient cooperative  Airway & Oxygen Therapy: Patient Spontanous Breathing and Patient connected to face mask oxygen  Post-op Assessment: Report given to RN and Post -op Vital signs reviewed and stable  Post vital signs: Reviewed and stable  Last Vitals:  Vitals Value Taken Time  BP 129/67 10/10/20 1221  Temp 36.1 C 10/10/20 1220  Pulse 89 10/10/20 1226  Resp 17 10/10/20 1226  SpO2 97 % 10/10/20 1226  Vitals shown include unvalidated device data.  Last Pain:  Vitals:   10/10/20 1220  TempSrc:   PainSc: 0-No pain      Patients Stated Pain Goal: 2 (01/56/15 3794)  Complications: No complications documented.

## 2020-10-13 ENCOUNTER — Telehealth: Payer: Self-pay | Admitting: Physician Assistant

## 2020-10-13 ENCOUNTER — Encounter (HOSPITAL_COMMUNITY): Payer: Self-pay | Admitting: Orthopedic Surgery

## 2020-10-13 NOTE — Telephone Encounter (Signed)
Patient called . Will come in On Friday. Wound vac need explained

## 2020-10-13 NOTE — Telephone Encounter (Signed)
Please see below.

## 2020-10-13 NOTE — Telephone Encounter (Signed)
Patient called requesting a call back from Dr. Sharol Given, PA Payne, or [place] Audrea Muscat. Patient states she has questions about wound vac and was un aware of having or needing and and would like more information. Please call patient at  [phone].

## 2020-10-17 ENCOUNTER — Encounter: Payer: Self-pay | Admitting: Family

## 2020-10-17 ENCOUNTER — Ambulatory Visit (INDEPENDENT_AMBULATORY_CARE_PROVIDER_SITE_OTHER): Payer: Medicare HMO | Admitting: Family

## 2020-10-17 DIAGNOSIS — S86011A Strain of right Achilles tendon, initial encounter: Secondary | ICD-10-CM

## 2020-10-17 MED ORDER — OXYCODONE-ACETAMINOPHEN 5-325 MG PO TABS: 1.0000 | ORAL_TABLET | Freq: Four times a day (QID) | ORAL | 0 refills | Status: AC | PRN

## 2020-10-17 NOTE — Progress Notes (Signed)
   Post-Op Visit Note   Patient: [name]           Date of Birth: [birth year]           MRN: [id number] Visit Date: 10/17/2020 PCP: Kathyrn Lass, MD  Chief Complaint:  Chief Complaint  Patient presents with  . Right Achilles Tendon - Routine Post Op    10/10/20 reconstruction     HPI:  HPI The patient is a 74 year old woman seen status post Achilles reconstruction on November 12 she has been nonweightbearing with a kneeling scooter.  She does have a Cam walker at home that she will begin using Ortho Exam On examination of the right lower extremity the incision well approximated sutures there is no bleeding no gaping no drainage no erythema very minimal lid swelling  Visit Diagnoses: No diagnosis found.  Plan:  May shower may get this wet.  Begin daily Dial soap cleansing dry dressing changes given to heel lifts to place in her cam walker.  She will continue nonweightbearing until follow-up  Follow-Up Instructions: Return in about 2 weeks (around 10/31/2020).   Imaging: No results found.  Orders:  No orders of the defined types were placed in this encounter.  Meds ordered this encounter  Medications  . oxyCODONE-acetaminophen (PERCOCET) 5-325 MG tablet    Sig: Take 1 tablet by mouth every 6 (six) hours as needed.    Dispense:  30 tablet    Refill:  0     PMFS History: Patient Active Problem List   Diagnosis Date Noted  . Chronic rupture of Achilles tendon   . Achilles tendon rupture, right, initial encounter 09/18/2020  . Degeneration of lumbar intervertebral disc 08/01/2018  . Lumbar radiculopathy 08/01/2018   Past Medical History:  Diagnosis Date  . Anxiety   . Arthritis   . Asthma   . Depression   . GERD (gastroesophageal reflux disease)   . Headache(784.0)   . Hypertension   . Hypothyroidism   . PONV (postoperative nausea and vomiting)   . Wears glasses     History reviewed. No pertinent family history.  Past Surgical History:  Procedure  Laterality Date  . ABDOMINAL HYSTERECTOMY  1981  . ACHILLES TENDON SURGERY Right 10/10/2020   Procedure: SECONDARY RECONSTRUCTION RIGHT ACHILLES;  Surgeon: Newt Minion, MD;  Location: [facility];  Service: Orthopedics;  Laterality: Right;  . APPENDECTOMY     age 89  . CHOLECYSTECTOMY  1/10   lap choli  . COLONOSCOPY    . CYSTOCELE REPAIR  8/10   ant/post-rectocele/cystocele repair  . OVARY SURGERY  1995  . SHOULDER ARTHROSCOPY WITH ROTATOR CUFF REPAIR AND SUBACROMIAL DECOMPRESSION  12/12/2012   Procedure: SHOULDER ARTHROSCOPY WITH ROTATOR CUFF REPAIR AND SUBACROMIAL DECOMPRESSION;  Surgeon: Garald Balding, MD;  Location: [facility];  Service: Orthopedics;  Laterality: Right;  Right Shoulder Mini Open Rotator Cuff Repair and Subacromial Decompression, possible Patch.  . TONSILLECTOMY     age 57  . TUBAL LIGATION  1976   Social History   Occupational History  . Not on file  Tobacco Use  . Smoking status: Former Smoker    Quit date: 12/07/1988    Years since quitting: 31.8  . Smokeless tobacco: Never Used  Substance and Sexual Activity  . Alcohol use: Yes    Comment: occ  . Drug use: Never  . Sexual activity: Not on file

## 2020-10-21 ENCOUNTER — Inpatient Hospital Stay: Payer: Medicare HMO | Admitting: Physician Assistant

## 2020-10-31 ENCOUNTER — Other Ambulatory Visit: Payer: Self-pay

## 2020-10-31 ENCOUNTER — Ambulatory Visit (INDEPENDENT_AMBULATORY_CARE_PROVIDER_SITE_OTHER): Payer: Medicare HMO | Admitting: Family

## 2020-10-31 DIAGNOSIS — S86019A Strain of unspecified Achilles tendon, initial encounter: Secondary | ICD-10-CM

## 2020-11-05 ENCOUNTER — Encounter: Payer: Self-pay | Admitting: Family

## 2020-11-05 NOTE — Progress Notes (Signed)
   Post-Op Visit Note   Patient: [name]           Date of Birth: 04-01-1946           MRN: [id number] Visit Date: 10/31/2020 PCP: Kathyrn Lass, MD  Chief Complaint:  Chief Complaint  Patient presents with  . Right Achilles Tendon - Routine Post Op    10/10/20 secondary reconstruction right achilles     HPI:  HPI The patient is a 74 year old woman seen status post Achilles reconstruction on November 12. she has been nonweightbearing with a kneeling scooter.  She does have a Cam walker which has been very uncomfortable putting pressure on her incision.  Ortho Exam On examination of the right lower extremity the incision well healed.  There is no gaping drainage erythema  Visit Diagnoses: No diagnosis found.  Plan: Sutures harvested today without incident.  She will get back into her cam walker with the 2 heel lifts.  She may begin weightbearing on the right she will follow-up in the office anticipate advancing her out of the Cam walker into regular shoewear with the lifts at follow-up Follow-Up Instructions: No follow-ups on file.   Imaging: No results found.  Orders:  No orders of the defined types were placed in this encounter.  No orders of the defined types were placed in this encounter.    PMFS History: Patient Active Problem List   Diagnosis Date Noted  . Chronic rupture of Achilles tendon   . Achilles tendon rupture, right, initial encounter 09/18/2020  . Degeneration of lumbar intervertebral disc 08/01/2018  . Lumbar radiculopathy 08/01/2018   Past Medical History:  Diagnosis Date  . Anxiety   . Arthritis   . Asthma   . Depression   . GERD (gastroesophageal reflux disease)   . Headache(784.0)   . Hypertension   . Hypothyroidism   . PONV (postoperative nausea and vomiting)   . Wears glasses     No family history on file.  Past Surgical History:  Procedure Laterality Date  . ABDOMINAL HYSTERECTOMY  1981  . ACHILLES TENDON SURGERY Right  10/10/2020   Procedure: SECONDARY RECONSTRUCTION RIGHT ACHILLES;  Surgeon: Newt Minion, MD;  Location: [facility];  Service: Orthopedics;  Laterality: Right;  . APPENDECTOMY     age 16  . CHOLECYSTECTOMY  1/10   lap choli  . COLONOSCOPY    . CYSTOCELE REPAIR  8/10   ant/post-rectocele/cystocele repair  . OVARY SURGERY  1995  . SHOULDER ARTHROSCOPY WITH ROTATOR CUFF REPAIR AND SUBACROMIAL DECOMPRESSION  12/12/2012   Procedure: SHOULDER ARTHROSCOPY WITH ROTATOR CUFF REPAIR AND SUBACROMIAL DECOMPRESSION;  Surgeon: Garald Balding, MD;  Location: [facility];  Service: Orthopedics;  Laterality: Right;  Right Shoulder Mini Open Rotator Cuff Repair and Subacromial Decompression, possible Patch.  . TONSILLECTOMY     age 42  . TUBAL LIGATION  1976   Social History   Occupational History  . Not on file  Tobacco Use  . Smoking status: Former Smoker    Quit date: 12/07/1988    Years since quitting: 31.9  . Smokeless tobacco: Never Used  Substance and Sexual Activity  . Alcohol use: Yes    Comment: occ  . Drug use: Never  . Sexual activity: Not on file

## 2020-11-14 ENCOUNTER — Encounter: Payer: Self-pay | Admitting: Physician Assistant

## 2020-11-14 ENCOUNTER — Ambulatory Visit (INDEPENDENT_AMBULATORY_CARE_PROVIDER_SITE_OTHER): Payer: Medicare HMO | Admitting: Physician Assistant

## 2020-11-14 DIAGNOSIS — S86019A Strain of unspecified Achilles tendon, initial encounter: Secondary | ICD-10-CM

## 2020-11-14 NOTE — Progress Notes (Signed)
Office Visit Note   Patient: [name]           Date of Birth: 1946-11-02           MRN: [id number] Visit Date: 11/14/2020              Requested by: Kathyrn Lass, [place],  [place] [address] PCP: Kathyrn Lass, MD  Chief Complaint  Patient presents with  . Right Achilles Tendon - Routine Post Op    10/10/20 secondary reconstruction achilles       HPI: Patient is 6 weeks status post right Achilles reconstruction.  Overall she is doing well she is weaning out of her short fracture boot with heel lifts.  She is interested in doing some physical therapy.  Assessment & Plan: Visit Diagnoses: No diagnosis found.  Plan: PT prescription was provided to her today.  Follow-up for final visit in 1 month  Follow-Up Instructions: No follow-ups on file.   Ortho Exam  Patient is alert, oriented, no adenopathy, well-dressed, normal affect, normal respiratory effort. Well-healed surgical incision.  Minimal soft tissue swelling.  No cellulitis.  Ankle range of motion is fairly flexible she has good plantar flexion strength.  No evidence of infection  Imaging: No results found. No images are attached to the encounter.  Labs: No results found for: HGBA1C, ESRSEDRATE, CRP, LABURIC, REPTSTATUS, GRAMSTAIN, CULT, LABORGA   Lab Results  Component Value Date   ALBUMIN 3.7 12/11/2012   ALBUMIN 3.8 12/10/2008    No results found for: MG No results found for: VD25OH  No results found for: PREALBUMIN CBC EXTENDED Latest Ref Rng & Units 10/10/2020 12/11/2012 07/03/2009  WBC 4.0 - 10.5 K/uL 6.4 6.6 9.6  RBC 3.87 - 5.11 MIL/uL 4.38 4.44 3.22(L)  HGB 12.0 - 15.0 g/dL 12.8 13.0 10.4(L)  HCT 36.0 - 46.0 % 37.7 38.2 29.3(L)  PLT 150 - 400 K/uL 298 299 252  NEUTROABS 1.7 - 7.7 K/uL - - -  LYMPHSABS 0.7 - 4.0 K/uL - - -     There is no height or weight on file to calculate BMI.  Orders:  No orders of the defined types were placed in this encounter.  No orders of  the defined types were placed in this encounter.    Procedures: No procedures performed  Clinical Data: No additional findings.  ROS:  All other systems negative, except as noted in the HPI. Review of Systems  Objective: Vital Signs: There were no vitals taken for this visit.  Specialty Comments:  No specialty comments available.  PMFS History: Patient Active Problem List   Diagnosis Date Noted  . Chronic rupture of Achilles tendon   . Achilles tendon rupture, right, initial encounter 09/18/2020  . Degeneration of lumbar intervertebral disc 08/01/2018  . Lumbar radiculopathy 08/01/2018   Past Medical History:  Diagnosis Date  . Anxiety   . Arthritis   . Asthma   . Depression   . GERD (gastroesophageal reflux disease)   . Headache(784.0)   . Hypertension   . Hypothyroidism   . PONV (postoperative nausea and vomiting)   . Wears glasses     No family history on file.  Past Surgical History:  Procedure Laterality Date  . ABDOMINAL HYSTERECTOMY  1981  . ACHILLES TENDON SURGERY Right 10/10/2020   Procedure: SECONDARY RECONSTRUCTION RIGHT ACHILLES;  Surgeon: Newt Minion, MD;  Location: [facility];  Service: Orthopedics;  Laterality: Right;  . APPENDECTOMY     age  42  . CHOLECYSTECTOMY  1/10   lap choli  . COLONOSCOPY    . CYSTOCELE REPAIR  8/10   ant/post-rectocele/cystocele repair  . OVARY SURGERY  1995  . SHOULDER ARTHROSCOPY WITH ROTATOR CUFF REPAIR AND SUBACROMIAL DECOMPRESSION  12/12/2012   Procedure: SHOULDER ARTHROSCOPY WITH ROTATOR CUFF REPAIR AND SUBACROMIAL DECOMPRESSION;  Surgeon: Garald Balding, MD;  Location: [facility];  Service: Orthopedics;  Laterality: Right;  Right Shoulder Mini Open Rotator Cuff Repair and Subacromial Decompression, possible Patch.  . TONSILLECTOMY     age 68  . TUBAL LIGATION  1976   Social History   Occupational History  . Not on file  Tobacco Use  . Smoking status: Former Smoker    Quit date: 12/07/1988     Years since quitting: 31.9  . Smokeless tobacco: Never Used  Substance and Sexual Activity  . Alcohol use: Yes    Comment: occ  . Drug use: Never  . Sexual activity: Not on file

## 2020-11-18 DIAGNOSIS — M6281 Muscle weakness (generalized): Secondary | ICD-10-CM | POA: Diagnosis not present

## 2020-11-18 DIAGNOSIS — M25671 Stiffness of right ankle, not elsewhere classified: Secondary | ICD-10-CM | POA: Diagnosis not present

## 2020-11-18 DIAGNOSIS — M7661 Achilles tendinitis, right leg: Secondary | ICD-10-CM | POA: Diagnosis not present

## 2020-11-18 DIAGNOSIS — M25571 Pain in right ankle and joints of right foot: Secondary | ICD-10-CM | POA: Diagnosis not present

## 2020-11-18 DIAGNOSIS — M25471 Effusion, right ankle: Secondary | ICD-10-CM | POA: Diagnosis not present

## 2020-11-20 DIAGNOSIS — M7661 Achilles tendinitis, right leg: Secondary | ICD-10-CM | POA: Diagnosis not present

## 2020-11-20 DIAGNOSIS — M25571 Pain in right ankle and joints of right foot: Secondary | ICD-10-CM | POA: Diagnosis not present

## 2020-11-20 DIAGNOSIS — R262 Difficulty in walking, not elsewhere classified: Secondary | ICD-10-CM | POA: Diagnosis not present

## 2020-11-20 DIAGNOSIS — M25471 Effusion, right ankle: Secondary | ICD-10-CM | POA: Diagnosis not present

## 2020-11-20 DIAGNOSIS — M25671 Stiffness of right ankle, not elsewhere classified: Secondary | ICD-10-CM | POA: Diagnosis not present

## 2020-11-20 DIAGNOSIS — M6281 Muscle weakness (generalized): Secondary | ICD-10-CM | POA: Diagnosis not present

## 2020-11-24 ENCOUNTER — Telehealth: Payer: Self-pay | Admitting: Physician Assistant

## 2020-11-24 DIAGNOSIS — M25471 Effusion, right ankle: Secondary | ICD-10-CM | POA: Diagnosis not present

## 2020-11-24 DIAGNOSIS — R262 Difficulty in walking, not elsewhere classified: Secondary | ICD-10-CM | POA: Diagnosis not present

## 2020-11-24 DIAGNOSIS — M6281 Muscle weakness (generalized): Secondary | ICD-10-CM | POA: Diagnosis not present

## 2020-11-24 DIAGNOSIS — M7661 Achilles tendinitis, right leg: Secondary | ICD-10-CM | POA: Diagnosis not present

## 2020-11-24 DIAGNOSIS — M25571 Pain in right ankle and joints of right foot: Secondary | ICD-10-CM | POA: Diagnosis not present

## 2020-11-24 DIAGNOSIS — M25671 Stiffness of right ankle, not elsewhere classified: Secondary | ICD-10-CM | POA: Diagnosis not present

## 2020-11-24 NOTE — Telephone Encounter (Signed)
Patient called requesting call back. She has lots of swelling. She is asking for PA Persons to call her at [phone].

## 2020-11-24 NOTE — Telephone Encounter (Signed)
Please see below and advise.

## 2020-11-24 NOTE — Telephone Encounter (Signed)
If she is concerned we can see her this week

## 2020-11-24 NOTE — Telephone Encounter (Signed)
Spoke with patient. She states that her therapist is concerned about the amount of swelling she is experiencing still. She said that she is not having any pain. [place] states that patient can come this week if needed. I have scheduled patient for this Thursday per her request.

## 2020-11-26 DIAGNOSIS — R262 Difficulty in walking, not elsewhere classified: Secondary | ICD-10-CM | POA: Diagnosis not present

## 2020-11-26 DIAGNOSIS — M6281 Muscle weakness (generalized): Secondary | ICD-10-CM | POA: Diagnosis not present

## 2020-11-26 DIAGNOSIS — M25671 Stiffness of right ankle, not elsewhere classified: Secondary | ICD-10-CM | POA: Diagnosis not present

## 2020-11-26 DIAGNOSIS — M25571 Pain in right ankle and joints of right foot: Secondary | ICD-10-CM | POA: Diagnosis not present

## 2020-11-26 DIAGNOSIS — M25471 Effusion, right ankle: Secondary | ICD-10-CM | POA: Diagnosis not present

## 2020-11-26 DIAGNOSIS — M7661 Achilles tendinitis, right leg: Secondary | ICD-10-CM | POA: Diagnosis not present

## 2020-11-27 ENCOUNTER — Encounter: Payer: Self-pay | Admitting: Physician Assistant

## 2020-11-27 ENCOUNTER — Other Ambulatory Visit: Payer: Self-pay

## 2020-11-27 ENCOUNTER — Ambulatory Visit (INDEPENDENT_AMBULATORY_CARE_PROVIDER_SITE_OTHER): Payer: Medicare HMO | Admitting: Physician Assistant

## 2020-11-27 DIAGNOSIS — S86019A Strain of unspecified Achilles tendon, initial encounter: Secondary | ICD-10-CM

## 2020-11-27 NOTE — Progress Notes (Signed)
Office Visit Note   Patient: [name]           Date of Birth: [birth year]           MRN: [id number] Visit Date: 11/27/2020              Requested by: Kathyrn Lass, [place],  [place] [address] PCP: Kathyrn Lass, MD  Chief Complaint  Patient presents with  . Right Ankle - Pain      HPI: This is a pleasant 74 year old woman who is 6 weeks status post secondary reconstruction of her Achilles on the right.  She has been progressing well but her physical therapist were concerned that she does have a lot of swelling after therapy.  She states when she wakes up in the morning she has no swelling at all.  She also has a history of varicose veins and was due to have a vein procedure but had the surgery first.  She also has a lot of sensitivity around the scar posteriorly and prefers an open back shoe  Assessment & Plan: Visit Diagnoses: No diagnosis found.  Plan: I do think given her varicosities and some of her swelling she would benefit from wearing a compression sock.  She does say she has one at home and I have encouraged her to wear this during the day.  I do not see any signs of infection.  I explained to her the pattern of having swelling as the day progresses is normal at this point in her recovery and certainly the sock would help with this.  Follow-up in 3 weeks  Follow-Up Instructions: No follow-ups on file.   Ortho Exam  Patient is alert, oriented, no adenopathy, well-dressed, normal affect, normal respiratory effort. Posterior right ankle well-healed surgical incision she does have some sensitivity and hypertrophic scarring.  Achilles is palpable and intact.  She has good plantar flexion strength.  Compartments are soft and compressible she has a little tenderness around the scar but no cellulitis no signs of infection minimal to no swelling currently distal pulses are palpable  Imaging: No results found. No images are attached to the  encounter.  Labs: No results found for: HGBA1C, ESRSEDRATE, CRP, LABURIC, REPTSTATUS, GRAMSTAIN, CULT, LABORGA   Lab Results  Component Value Date   ALBUMIN 3.7 12/11/2012   ALBUMIN 3.8 12/10/2008    No results found for: MG No results found for: VD25OH  No results found for: PREALBUMIN CBC EXTENDED Latest Ref Rng & Units 10/10/2020 12/11/2012 07/03/2009  WBC 4.0 - 10.5 K/uL 6.4 6.6 9.6  RBC 3.87 - 5.11 MIL/uL 4.38 4.44 3.22(L)  HGB 12.0 - 15.0 g/dL 12.8 13.0 10.4(L)  HCT 36.0 - 46.0 % 37.7 38.2 29.3(L)  PLT 150 - 400 K/uL 298 299 252  NEUTROABS 1.7 - 7.7 K/uL - - -  LYMPHSABS 0.7 - 4.0 K/uL - - -     There is no height or weight on file to calculate BMI.  Orders:  No orders of the defined types were placed in this encounter.  No orders of the defined types were placed in this encounter.    Procedures: No procedures performed  Clinical Data: No additional findings.  ROS:  All other systems negative, except as noted in the HPI. Review of Systems  Objective: Vital Signs: There were no vitals taken for this visit.  Specialty Comments:  No specialty comments available.  PMFS History: Patient Active Problem List   Diagnosis Date Noted  .  Chronic rupture of Achilles tendon   . Achilles tendon rupture, right, initial encounter 09/18/2020  . Degeneration of lumbar intervertebral disc 08/01/2018  . Lumbar radiculopathy 08/01/2018   Past Medical History:  Diagnosis Date  . Anxiety   . Arthritis   . Asthma   . Depression   . GERD (gastroesophageal reflux disease)   . Headache(784.0)   . Hypertension   . Hypothyroidism   . PONV (postoperative nausea and vomiting)   . Wears glasses     No family history on file.  Past Surgical History:  Procedure Laterality Date  . ABDOMINAL HYSTERECTOMY  1981  . ACHILLES TENDON SURGERY Right 10/10/2020   Procedure: SECONDARY RECONSTRUCTION RIGHT ACHILLES;  Surgeon: Nadara Mustard, MD;  Location: [facility];  Service:  Orthopedics;  Laterality: Right;  . APPENDECTOMY     age 14  . CHOLECYSTECTOMY  1/10   lap choli  . COLONOSCOPY    . CYSTOCELE REPAIR  8/10   ant/post-rectocele/cystocele repair  . OVARY SURGERY  1995  . SHOULDER ARTHROSCOPY WITH ROTATOR CUFF REPAIR AND SUBACROMIAL DECOMPRESSION  12/12/2012   Procedure: SHOULDER ARTHROSCOPY WITH ROTATOR CUFF REPAIR AND SUBACROMIAL DECOMPRESSION;  Surgeon: Valeria Batman, MD;  Location: [facility];  Service: Orthopedics;  Laterality: Right;  Right Shoulder Mini Open Rotator Cuff Repair and Subacromial Decompression, possible Patch.  . TONSILLECTOMY     age 28  . TUBAL LIGATION  1976   Social History   Occupational History  . Not on file  Tobacco Use  . Smoking status: Former Smoker    Quit date: 12/07/1988    Years since quitting: 31.9  . Smokeless tobacco: Never Used  Substance and Sexual Activity  . Alcohol use: Yes    Comment: occ  . Drug use: Never  . Sexual activity: Not on file

## 2020-12-02 DIAGNOSIS — M7661 Achilles tendinitis, right leg: Secondary | ICD-10-CM | POA: Diagnosis not present

## 2020-12-02 DIAGNOSIS — R262 Difficulty in walking, not elsewhere classified: Secondary | ICD-10-CM | POA: Diagnosis not present

## 2020-12-02 DIAGNOSIS — M25471 Effusion, right ankle: Secondary | ICD-10-CM | POA: Diagnosis not present

## 2020-12-02 DIAGNOSIS — M25571 Pain in right ankle and joints of right foot: Secondary | ICD-10-CM | POA: Diagnosis not present

## 2020-12-02 DIAGNOSIS — M6281 Muscle weakness (generalized): Secondary | ICD-10-CM | POA: Diagnosis not present

## 2020-12-02 DIAGNOSIS — M25671 Stiffness of right ankle, not elsewhere classified: Secondary | ICD-10-CM | POA: Diagnosis not present

## 2020-12-04 DIAGNOSIS — M6281 Muscle weakness (generalized): Secondary | ICD-10-CM | POA: Diagnosis not present

## 2020-12-04 DIAGNOSIS — M7661 Achilles tendinitis, right leg: Secondary | ICD-10-CM | POA: Diagnosis not present

## 2020-12-04 DIAGNOSIS — M25571 Pain in right ankle and joints of right foot: Secondary | ICD-10-CM | POA: Diagnosis not present

## 2020-12-04 DIAGNOSIS — M25671 Stiffness of right ankle, not elsewhere classified: Secondary | ICD-10-CM | POA: Diagnosis not present

## 2020-12-04 DIAGNOSIS — R262 Difficulty in walking, not elsewhere classified: Secondary | ICD-10-CM | POA: Diagnosis not present

## 2020-12-04 DIAGNOSIS — M25471 Effusion, right ankle: Secondary | ICD-10-CM | POA: Diagnosis not present

## 2020-12-05 ENCOUNTER — Ambulatory Visit: Payer: Medicare HMO | Admitting: Physician Assistant

## 2020-12-09 DIAGNOSIS — M6281 Muscle weakness (generalized): Secondary | ICD-10-CM | POA: Diagnosis not present

## 2020-12-09 DIAGNOSIS — M7661 Achilles tendinitis, right leg: Secondary | ICD-10-CM | POA: Diagnosis not present

## 2020-12-09 DIAGNOSIS — R262 Difficulty in walking, not elsewhere classified: Secondary | ICD-10-CM | POA: Diagnosis not present

## 2020-12-09 DIAGNOSIS — M25671 Stiffness of right ankle, not elsewhere classified: Secondary | ICD-10-CM | POA: Diagnosis not present

## 2020-12-09 DIAGNOSIS — M25471 Effusion, right ankle: Secondary | ICD-10-CM | POA: Diagnosis not present

## 2020-12-09 DIAGNOSIS — M25571 Pain in right ankle and joints of right foot: Secondary | ICD-10-CM | POA: Diagnosis not present

## 2020-12-11 DIAGNOSIS — M7661 Achilles tendinitis, right leg: Secondary | ICD-10-CM | POA: Diagnosis not present

## 2020-12-11 DIAGNOSIS — M6281 Muscle weakness (generalized): Secondary | ICD-10-CM | POA: Diagnosis not present

## 2020-12-11 DIAGNOSIS — M25471 Effusion, right ankle: Secondary | ICD-10-CM | POA: Diagnosis not present

## 2020-12-11 DIAGNOSIS — M25671 Stiffness of right ankle, not elsewhere classified: Secondary | ICD-10-CM | POA: Diagnosis not present

## 2020-12-11 DIAGNOSIS — M25571 Pain in right ankle and joints of right foot: Secondary | ICD-10-CM | POA: Diagnosis not present

## 2020-12-11 DIAGNOSIS — R262 Difficulty in walking, not elsewhere classified: Secondary | ICD-10-CM | POA: Diagnosis not present

## 2020-12-16 DIAGNOSIS — M6281 Muscle weakness (generalized): Secondary | ICD-10-CM | POA: Diagnosis not present

## 2020-12-16 DIAGNOSIS — M25671 Stiffness of right ankle, not elsewhere classified: Secondary | ICD-10-CM | POA: Diagnosis not present

## 2020-12-16 DIAGNOSIS — R262 Difficulty in walking, not elsewhere classified: Secondary | ICD-10-CM | POA: Diagnosis not present

## 2020-12-16 DIAGNOSIS — M25571 Pain in right ankle and joints of right foot: Secondary | ICD-10-CM | POA: Diagnosis not present

## 2020-12-16 DIAGNOSIS — M7661 Achilles tendinitis, right leg: Secondary | ICD-10-CM | POA: Diagnosis not present

## 2020-12-16 DIAGNOSIS — M25471 Effusion, right ankle: Secondary | ICD-10-CM | POA: Diagnosis not present

## 2020-12-18 ENCOUNTER — Ambulatory Visit: Payer: Medicare HMO | Admitting: Physician Assistant

## 2020-12-19 DIAGNOSIS — R262 Difficulty in walking, not elsewhere classified: Secondary | ICD-10-CM | POA: Diagnosis not present

## 2020-12-19 DIAGNOSIS — M25571 Pain in right ankle and joints of right foot: Secondary | ICD-10-CM | POA: Diagnosis not present

## 2020-12-19 DIAGNOSIS — M25471 Effusion, right ankle: Secondary | ICD-10-CM | POA: Diagnosis not present

## 2020-12-19 DIAGNOSIS — M6281 Muscle weakness (generalized): Secondary | ICD-10-CM | POA: Diagnosis not present

## 2020-12-19 DIAGNOSIS — M25671 Stiffness of right ankle, not elsewhere classified: Secondary | ICD-10-CM | POA: Diagnosis not present

## 2020-12-19 DIAGNOSIS — M7661 Achilles tendinitis, right leg: Secondary | ICD-10-CM | POA: Diagnosis not present

## 2020-12-22 ENCOUNTER — Ambulatory Visit (INDEPENDENT_AMBULATORY_CARE_PROVIDER_SITE_OTHER): Payer: Medicare HMO | Admitting: Physician Assistant

## 2020-12-22 ENCOUNTER — Encounter: Payer: Self-pay | Admitting: Physician Assistant

## 2020-12-22 VITALS — Ht 63.5 in | Wt 137.0 lb

## 2020-12-22 DIAGNOSIS — S86019A Strain of unspecified Achilles tendon, initial encounter: Secondary | ICD-10-CM

## 2020-12-22 NOTE — Progress Notes (Signed)
Office Visit Note   Patient: [name]           Date of Birth: 05-29-1946           MRN: [id number] Visit Date: 12/22/2020              Requested by: Kathyrn Lass, [place],  [place] [address] PCP: Kathyrn Lass, MD  Chief Complaint  Patient presents with  . Right Ankle - Follow-up    10/10/2020 secondary reconstruction right achilles      HPI: Patient presents today 2-1/2 months status post secondary reconstruction of her right Achilles.  Physical therapy and reports that she has had significant improvement.  She still gets some swelling towards the end of the day and cannot wear all of her shoes.  Denies fever, chills, or calf pain  Assessment & Plan: Visit Diagnoses: No diagnosis found.  Plan: We will complete physical therapy and follow-up for one final visit.  Follow-Up Instructions: No follow-ups on file.   Ortho Exam  Patient is alert, oriented, no adenopathy, well-dressed, normal affect, normal respiratory effort. Right lower extremity swelling is well controlled she is well-healed surgical incision without any erythema or ascending cellulitis.  She does have a palpable Achilles.  Some bruising around the lower leg.  Her compartments are soft and nontender negative Bevelyn Buckles' sign she has good resisted plantar flexion dorsiflexion inversion and eversion  Imaging: No results found. No images are attached to the encounter.  Labs: No results found for: HGBA1C, ESRSEDRATE, CRP, LABURIC, REPTSTATUS, GRAMSTAIN, CULT, LABORGA   Lab Results  Component Value Date   ALBUMIN 3.7 12/11/2012   ALBUMIN 3.8 12/10/2008    No results found for: MG No results found for: VD25OH  No results found for: PREALBUMIN CBC EXTENDED Latest Ref Rng & Units 10/10/2020 12/11/2012 07/03/2009  WBC 4.0 - 10.5 K/uL 6.4 6.6 9.6  RBC 3.87 - 5.11 MIL/uL 4.38 4.44 3.22(L)  HGB 12.0 - 15.0 g/dL 12.8 13.0 10.4(L)  HCT 36.0 - 46.0 % 37.7 38.2 29.3(L)  PLT 150 - 400 K/uL 298  299 252  NEUTROABS 1.7 - 7.7 K/uL - - -  LYMPHSABS 0.7 - 4.0 K/uL - - -     Body mass index is 23.89 kg/m.  Orders:  No orders of the defined types were placed in this encounter.  No orders of the defined types were placed in this encounter.    Procedures: No procedures performed  Clinical Data: No additional findings.  ROS:  All other systems negative, except as noted in the HPI. Review of Systems  Objective: Vital Signs: Ht 5' 3.5" (1.613 m)   Wt 137 lb (62.1 kg)   BMI 23.89 kg/m   Specialty Comments:  No specialty comments available.  PMFS History: Patient Active Problem List   Diagnosis Date Noted  . Chronic rupture of Achilles tendon   . Achilles tendon rupture, right, initial encounter 09/18/2020  . Degeneration of lumbar intervertebral disc 08/01/2018  . Lumbar radiculopathy 08/01/2018   Past Medical History:  Diagnosis Date  . Anxiety   . Arthritis   . Asthma   . Depression   . GERD (gastroesophageal reflux disease)   . Headache(784.0)   . Hypertension   . Hypothyroidism   . PONV (postoperative nausea and vomiting)   . Wears glasses     No family history on file.  Past Surgical History:  Procedure Laterality Date  . ABDOMINAL HYSTERECTOMY  1981  . ACHILLES TENDON  SURGERY Right 10/10/2020   Procedure: SECONDARY RECONSTRUCTION RIGHT ACHILLES;  Surgeon: Newt Minion, MD;  Location: [facility];  Service: Orthopedics;  Laterality: Right;  . APPENDECTOMY     age 46  . CHOLECYSTECTOMY  1/10   lap choli  . COLONOSCOPY    . CYSTOCELE REPAIR  8/10   ant/post-rectocele/cystocele repair  . OVARY SURGERY  1995  . SHOULDER ARTHROSCOPY WITH ROTATOR CUFF REPAIR AND SUBACROMIAL DECOMPRESSION  12/12/2012   Procedure: SHOULDER ARTHROSCOPY WITH ROTATOR CUFF REPAIR AND SUBACROMIAL DECOMPRESSION;  Surgeon: Garald Balding, MD;  Location: [facility];  Service: Orthopedics;  Laterality: Right;  Right Shoulder Mini Open Rotator Cuff Repair and  Subacromial Decompression, possible Patch.  . TONSILLECTOMY     age 28  . TUBAL LIGATION  1976   Social History   Occupational History  . Not on file  Tobacco Use  . Smoking status: Former Smoker    Quit date: 12/07/1988    Years since quitting: 32.0  . Smokeless tobacco: Never Used  Substance and Sexual Activity  . Alcohol use: Yes    Comment: occ  . Drug use: Never  . Sexual activity: Not on file

## 2020-12-23 DIAGNOSIS — M25471 Effusion, right ankle: Secondary | ICD-10-CM | POA: Diagnosis not present

## 2020-12-23 DIAGNOSIS — M25571 Pain in right ankle and joints of right foot: Secondary | ICD-10-CM | POA: Diagnosis not present

## 2020-12-23 DIAGNOSIS — R262 Difficulty in walking, not elsewhere classified: Secondary | ICD-10-CM | POA: Diagnosis not present

## 2020-12-23 DIAGNOSIS — M7661 Achilles tendinitis, right leg: Secondary | ICD-10-CM | POA: Diagnosis not present

## 2020-12-23 DIAGNOSIS — M25671 Stiffness of right ankle, not elsewhere classified: Secondary | ICD-10-CM | POA: Diagnosis not present

## 2020-12-23 DIAGNOSIS — M6281 Muscle weakness (generalized): Secondary | ICD-10-CM | POA: Diagnosis not present

## 2020-12-25 DIAGNOSIS — M6281 Muscle weakness (generalized): Secondary | ICD-10-CM | POA: Diagnosis not present

## 2020-12-25 DIAGNOSIS — M25671 Stiffness of right ankle, not elsewhere classified: Secondary | ICD-10-CM | POA: Diagnosis not present

## 2020-12-25 DIAGNOSIS — R262 Difficulty in walking, not elsewhere classified: Secondary | ICD-10-CM | POA: Diagnosis not present

## 2020-12-25 DIAGNOSIS — M25471 Effusion, right ankle: Secondary | ICD-10-CM | POA: Diagnosis not present

## 2020-12-25 DIAGNOSIS — M7661 Achilles tendinitis, right leg: Secondary | ICD-10-CM | POA: Diagnosis not present

## 2020-12-25 DIAGNOSIS — M25571 Pain in right ankle and joints of right foot: Secondary | ICD-10-CM | POA: Diagnosis not present

## 2020-12-29 DIAGNOSIS — J449 Chronic obstructive pulmonary disease, unspecified: Secondary | ICD-10-CM | POA: Diagnosis not present

## 2020-12-29 DIAGNOSIS — E039 Hypothyroidism, unspecified: Secondary | ICD-10-CM | POA: Diagnosis not present

## 2020-12-29 DIAGNOSIS — K219 Gastro-esophageal reflux disease without esophagitis: Secondary | ICD-10-CM | POA: Diagnosis not present

## 2020-12-29 DIAGNOSIS — F325 Major depressive disorder, single episode, in full remission: Secondary | ICD-10-CM | POA: Diagnosis not present

## 2020-12-29 DIAGNOSIS — J45909 Unspecified asthma, uncomplicated: Secondary | ICD-10-CM | POA: Diagnosis not present

## 2020-12-29 DIAGNOSIS — I10 Essential (primary) hypertension: Secondary | ICD-10-CM | POA: Diagnosis not present

## 2020-12-29 DIAGNOSIS — M1990 Unspecified osteoarthritis, unspecified site: Secondary | ICD-10-CM | POA: Diagnosis not present

## 2020-12-29 DIAGNOSIS — E7800 Pure hypercholesterolemia, unspecified: Secondary | ICD-10-CM | POA: Diagnosis not present

## 2020-12-29 DIAGNOSIS — F329 Major depressive disorder, single episode, unspecified: Secondary | ICD-10-CM | POA: Diagnosis not present

## 2020-12-31 DIAGNOSIS — M25671 Stiffness of right ankle, not elsewhere classified: Secondary | ICD-10-CM | POA: Diagnosis not present

## 2020-12-31 DIAGNOSIS — M25571 Pain in right ankle and joints of right foot: Secondary | ICD-10-CM | POA: Diagnosis not present

## 2020-12-31 DIAGNOSIS — M7661 Achilles tendinitis, right leg: Secondary | ICD-10-CM | POA: Diagnosis not present

## 2020-12-31 DIAGNOSIS — M25471 Effusion, right ankle: Secondary | ICD-10-CM | POA: Diagnosis not present

## 2020-12-31 DIAGNOSIS — R262 Difficulty in walking, not elsewhere classified: Secondary | ICD-10-CM | POA: Diagnosis not present

## 2020-12-31 DIAGNOSIS — M6281 Muscle weakness (generalized): Secondary | ICD-10-CM | POA: Diagnosis not present

## 2021-01-08 DIAGNOSIS — M25571 Pain in right ankle and joints of right foot: Secondary | ICD-10-CM | POA: Diagnosis not present

## 2021-01-08 DIAGNOSIS — R262 Difficulty in walking, not elsewhere classified: Secondary | ICD-10-CM | POA: Diagnosis not present

## 2021-01-08 DIAGNOSIS — M25671 Stiffness of right ankle, not elsewhere classified: Secondary | ICD-10-CM | POA: Diagnosis not present

## 2021-01-08 DIAGNOSIS — M7661 Achilles tendinitis, right leg: Secondary | ICD-10-CM | POA: Diagnosis not present

## 2021-01-08 DIAGNOSIS — M25471 Effusion, right ankle: Secondary | ICD-10-CM | POA: Diagnosis not present

## 2021-01-08 DIAGNOSIS — M6281 Muscle weakness (generalized): Secondary | ICD-10-CM | POA: Diagnosis not present

## 2021-01-19 ENCOUNTER — Ambulatory Visit (INDEPENDENT_AMBULATORY_CARE_PROVIDER_SITE_OTHER): Payer: Medicare HMO | Admitting: Physician Assistant

## 2021-01-19 ENCOUNTER — Encounter: Payer: Self-pay | Admitting: Orthopedic Surgery

## 2021-01-19 DIAGNOSIS — S86019A Strain of unspecified Achilles tendon, initial encounter: Secondary | ICD-10-CM

## 2021-01-19 NOTE — Progress Notes (Signed)
Office Visit Note   Patient: [name]           Date of Birth: Mar 20, 1946           MRN: [id number] Visit Date: 01/19/2021              Requested by: Kathyrn Lass, [place],  [place] [address] PCP: Kathyrn Lass, MD  Chief Complaint  Patient presents with  . Right Achilles Tendon - Routine Post Op    10/10/20 secondary reconstruction Achilles       HPI: Patient is a pleasant 75 year old woman who is over 3 months status post secondary reconstruction of her right Achilles.  She has completed physical therapy and is doing a home exercise program.  She gets occasional twinges of pain but other than that has no complaints and feels better than prior to surgery  Assessment & Plan: Visit Diagnoses: No diagnosis found.  Plan: Continue to do scar massage with [place] or cocoa butter.  Continue strengthening.  She would do well in a compression sock though she has difficulty putting this on.  May follow-up with [place] as needed  Follow-Up Instructions: No follow-ups on file.   Ortho Exam  Patient is alert, oriented, no adenopathy, well-dressed, normal affect, normal respiratory effort. Focused examination of her right ankle demonstrates well-healed surgical incision.  No cellulitis no erythema no swelling.  She has good plantar flexion dorsiflexion strength.  Imaging: No results found. No images are attached to the encounter.  Labs: No results found for: HGBA1C, ESRSEDRATE, CRP, LABURIC, REPTSTATUS, GRAMSTAIN, CULT, LABORGA   Lab Results  Component Value Date   ALBUMIN 3.7 12/11/2012   ALBUMIN 3.8 12/10/2008    No results found for: MG No results found for: VD25OH  No results found for: PREALBUMIN CBC EXTENDED Latest Ref Rng & Units 10/10/2020 12/11/2012 07/03/2009  WBC 4.0 - 10.5 K/uL 6.4 6.6 9.6  RBC 3.87 - 5.11 MIL/uL 4.38 4.44 3.22(L)  HGB 12.0 - 15.0 g/dL 12.8 13.0 10.4(L)  HCT 36.0 - 46.0 % 37.7 38.2 29.3(L)  PLT 150 - 400 K/uL 298 299 252   NEUTROABS 1.7 - 7.7 K/uL - - -  LYMPHSABS 0.7 - 4.0 K/uL - - -     There is no height or weight on file to calculate BMI.  Orders:  No orders of the defined types were placed in this encounter.  No orders of the defined types were placed in this encounter.    Procedures: No procedures performed  Clinical Data: No additional findings.  ROS:  All other systems negative, except as noted in the HPI. Review of Systems  Objective: Vital Signs: There were no vitals taken for this visit.  Specialty Comments:  No specialty comments available.  PMFS History: Patient Active Problem List   Diagnosis Date Noted  . Chronic rupture of Achilles tendon   . Achilles tendon rupture, right, initial encounter 09/18/2020  . Degeneration of lumbar intervertebral disc 08/01/2018  . Lumbar radiculopathy 08/01/2018   Past Medical History:  Diagnosis Date  . Anxiety   . Arthritis   . Asthma   . Depression   . GERD (gastroesophageal reflux disease)   . Headache(784.0)   . Hypertension   . Hypothyroidism   . PONV (postoperative nausea and vomiting)   . Wears glasses     No family history on file.  Past Surgical History:  Procedure Laterality Date  . ABDOMINAL HYSTERECTOMY  1981  . ACHILLES TENDON SURGERY  Right 10/10/2020   Procedure: SECONDARY RECONSTRUCTION RIGHT ACHILLES;  Surgeon: Newt Minion, MD;  Location: [facility];  Service: Orthopedics;  Laterality: Right;  . APPENDECTOMY     age 65  . CHOLECYSTECTOMY  1/10   lap choli  . COLONOSCOPY    . CYSTOCELE REPAIR  8/10   ant/post-rectocele/cystocele repair  . OVARY SURGERY  1995  . SHOULDER ARTHROSCOPY WITH ROTATOR CUFF REPAIR AND SUBACROMIAL DECOMPRESSION  12/12/2012   Procedure: SHOULDER ARTHROSCOPY WITH ROTATOR CUFF REPAIR AND SUBACROMIAL DECOMPRESSION;  Surgeon: Garald Balding, MD;  Location: [facility];  Service: Orthopedics;  Laterality: Right;  Right Shoulder Mini Open Rotator Cuff Repair and Subacromial  Decompression, possible Patch.  . TONSILLECTOMY     age 62  . TUBAL LIGATION  1976   Social History   Occupational History  . Not on file  Tobacco Use  . Smoking status: Former Smoker    Quit date: 12/07/1988    Years since quitting: 32.1  . Smokeless tobacco: Never Used  Substance and Sexual Activity  . Alcohol use: Yes    Comment: occ  . Drug use: Never  . Sexual activity: Not on file

## 2021-01-30 DIAGNOSIS — F329 Major depressive disorder, single episode, unspecified: Secondary | ICD-10-CM | POA: Diagnosis not present

## 2021-01-30 DIAGNOSIS — I10 Essential (primary) hypertension: Secondary | ICD-10-CM | POA: Diagnosis not present

## 2021-01-30 DIAGNOSIS — E039 Hypothyroidism, unspecified: Secondary | ICD-10-CM | POA: Diagnosis not present

## 2021-01-30 DIAGNOSIS — E7800 Pure hypercholesterolemia, unspecified: Secondary | ICD-10-CM | POA: Diagnosis not present

## 2021-01-30 DIAGNOSIS — J449 Chronic obstructive pulmonary disease, unspecified: Secondary | ICD-10-CM | POA: Diagnosis not present

## 2021-01-30 DIAGNOSIS — J4540 Moderate persistent asthma, uncomplicated: Secondary | ICD-10-CM | POA: Diagnosis not present

## 2021-01-30 DIAGNOSIS — J45909 Unspecified asthma, uncomplicated: Secondary | ICD-10-CM | POA: Diagnosis not present

## 2021-01-30 DIAGNOSIS — M1990 Unspecified osteoarthritis, unspecified site: Secondary | ICD-10-CM | POA: Diagnosis not present

## 2021-01-30 DIAGNOSIS — K219 Gastro-esophageal reflux disease without esophagitis: Secondary | ICD-10-CM | POA: Diagnosis not present

## 2021-01-31 IMAGING — MR MR ANKLE*R* WO/W CM
10 series · 39 of 40 positions shown · IV contrast (multihance)
Comparison: X-ray 07/17/2020

CLINICAL DATA: Right ankle pain since Sunday March, 2020. Clinical concern
for Achilles tendon tear.

EXAM:
MRI OF THE RIGHT ANKLE WITHOUT AND WITH CONTRAST
TECHNIQUE: Multiplanar, multisequence MR imaging of the ankle was performed
before and after the administration of intravenous contrast.
CONTRAST:  15mL MULTIHANCE GADOBENATE DIMEGLUMINE 529 MG/ML IV SOLN

[Series 3: T2 fat-sat · axial · 3.0mm · 0.50mm/px · z∈[-26,+95]mm · 5 of 32 slices shown (1 of 2)]
[im 1/32]
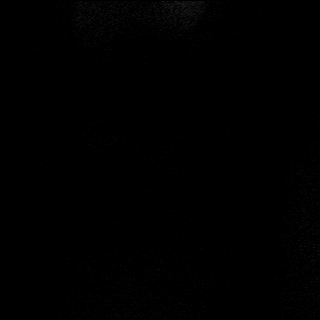
[im 8/32]
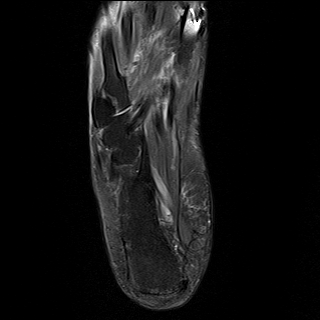
[im 16/32]
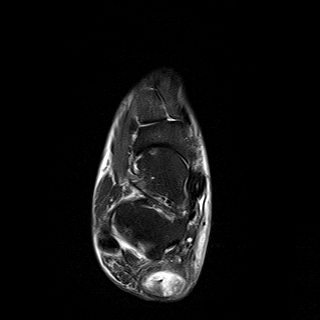
[im 24/32]
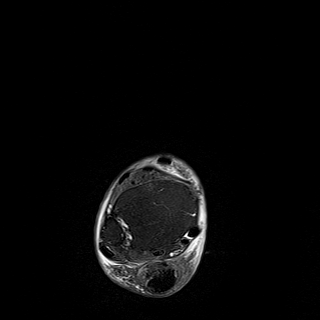
[im 32/32]
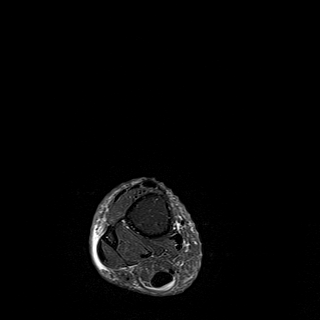

[Series 4: PD fat-sat · axial · 3.0mm · 0.42mm/px · z∈[-26,+95]mm · 4 of 32 slices shown]
[im 1/32]
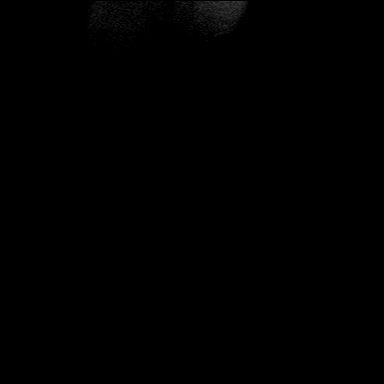
[im 11/32]
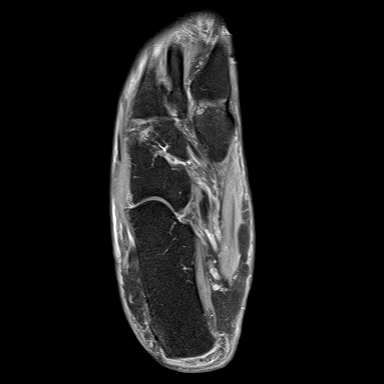
[im 21/32]
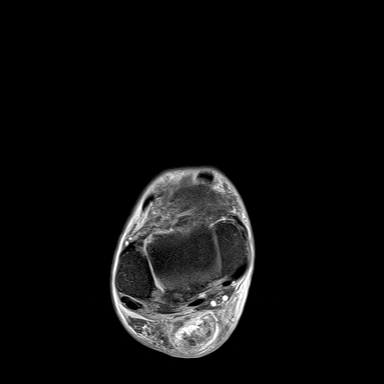
[im 32/32]
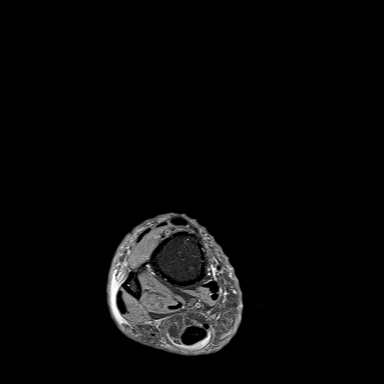

[Series 5: T1 · axial · 3.0mm · 0.50mm/px · z∈[-25,+96]mm · 4 of 32 slices shown (1 of 2)]
[im 1/32]
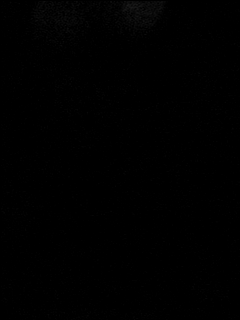
[im 11/32]
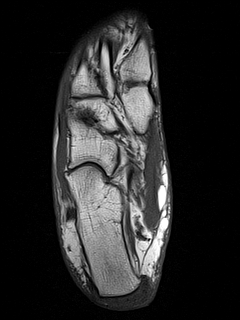
[im 21/32]
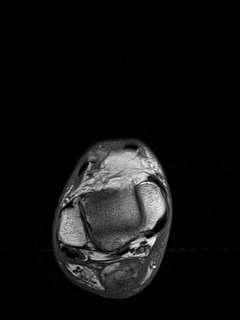
[im 32/32]
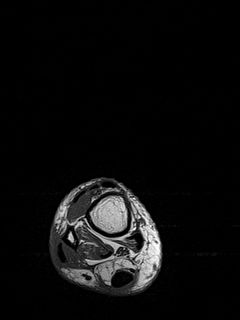

[Series 6: T1 · sagittal · 4.0mm · 0.56mm/px · 3 of 20 slices shown (2 of 2)]
[im 1/20]
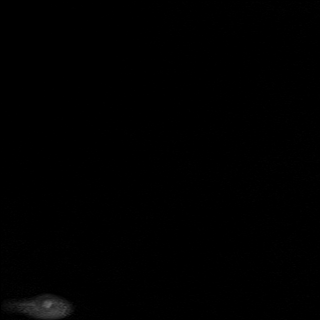
[im 10/20]
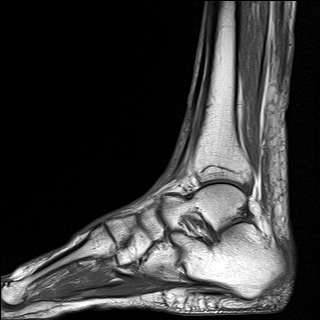
[im 20/20]
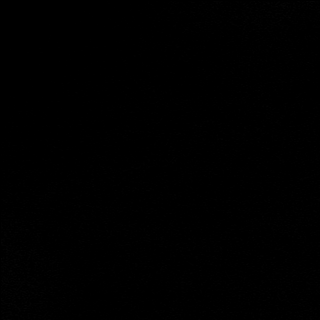

[Series 7: STIR · sagittal · 4.0mm · 0.35mm/px · 3 of 20 slices shown]
[im 1/20]
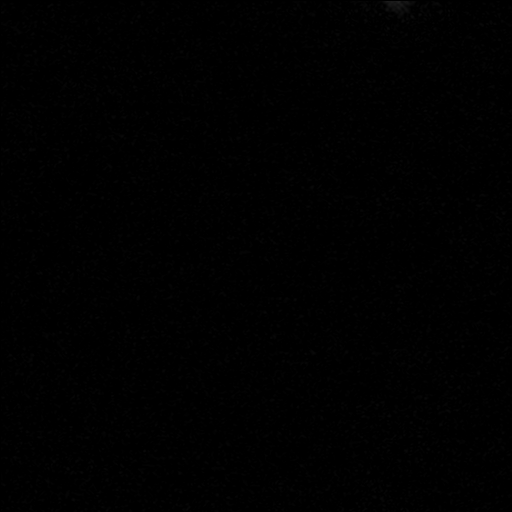
[im 10/20]
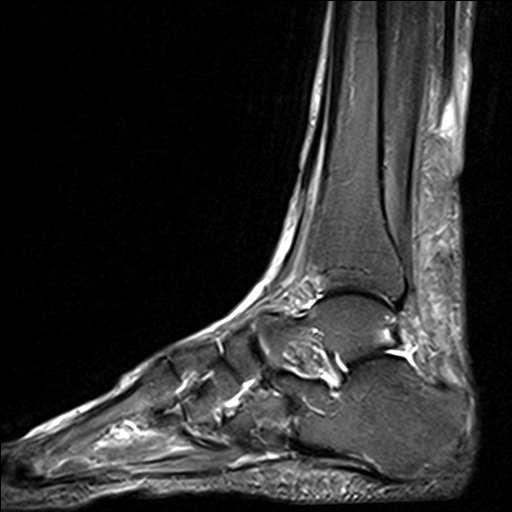
[im 20/20]
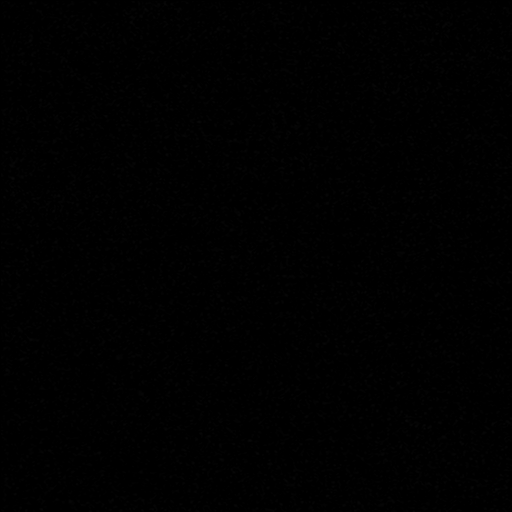

[Series 8: T2 fat-sat · coronal · 3.0mm · 0.50mm/px · 6 of 43 slices shown (2 of 2)]
[im 1/43]
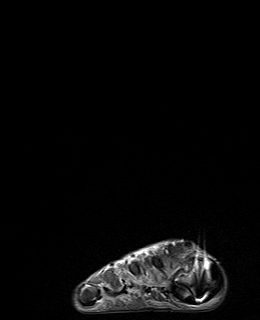
[im 9/43]
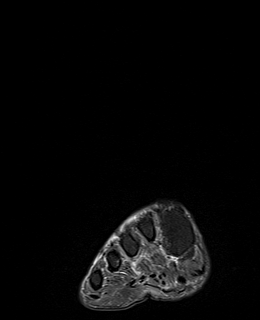
[im 17/43]
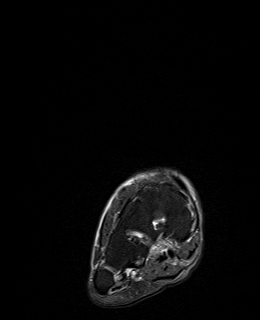
[im 26/43]
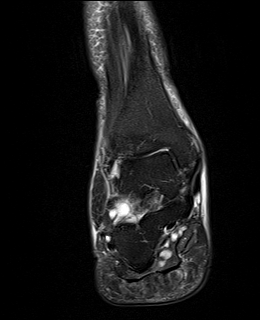
[im 34/43]
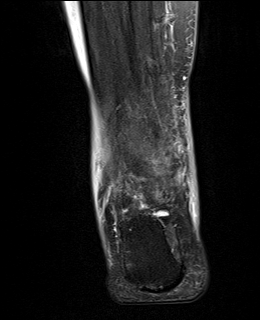
[im 43/43]
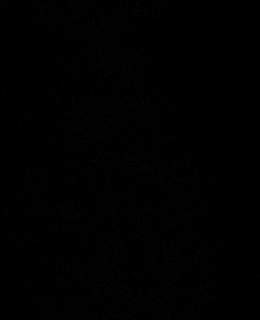

[Series 9: T1 fat-sat · axial · 3.0mm · 0.83mm/px · z∈[-25,+96]mm · 4 of 32 slices shown]
[im 1/32]
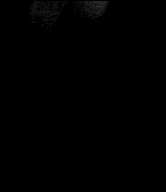
[im 11/32]
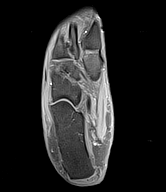
[im 21/32]
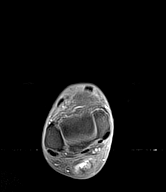
[im 32/32]
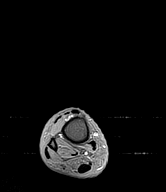

[Series 10: T1 post-contrast · axial · 3.0mm · 0.62mm/px · z∈[-25,+92]mm · 3 of 24 slices shown (1 of 2)]
[im 1/24]
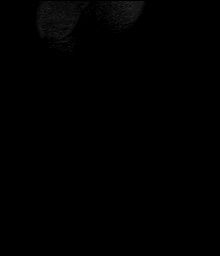
[im 12/24]
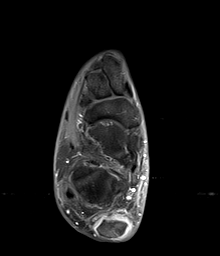
[im 24/24]
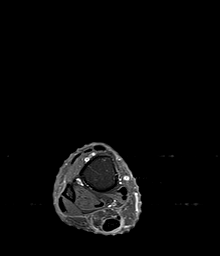

[Series 11: T1 post-contrast · sagittal · 4.0mm · 0.56mm/px · 2 of 18 slices shown (2 of 2)]
[im 1/18]
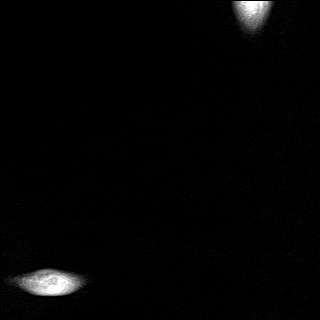
[im 18/18]
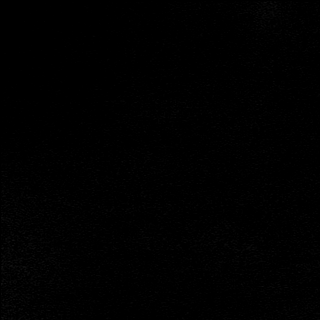

[Series 12: cor post · coronal · 3.0mm · 0.31mm/px · 5 of 43 slices shown]
[im 1/43]
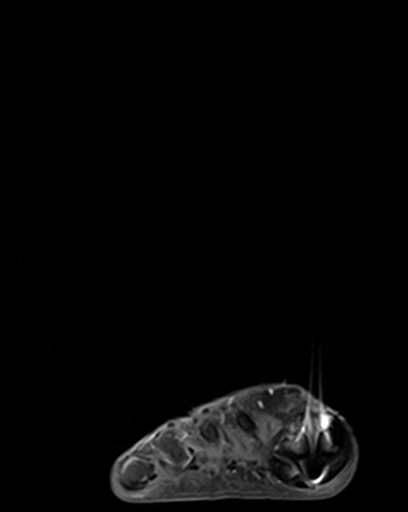
[im 9/43]
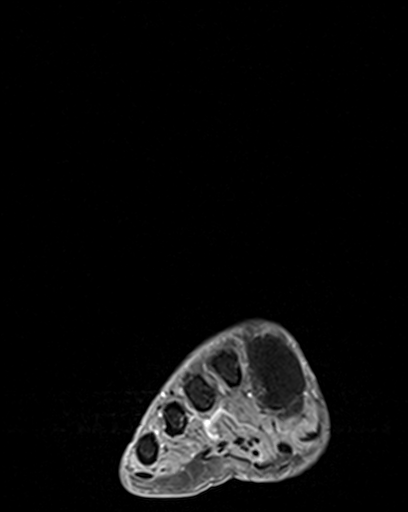
[im 17/43]
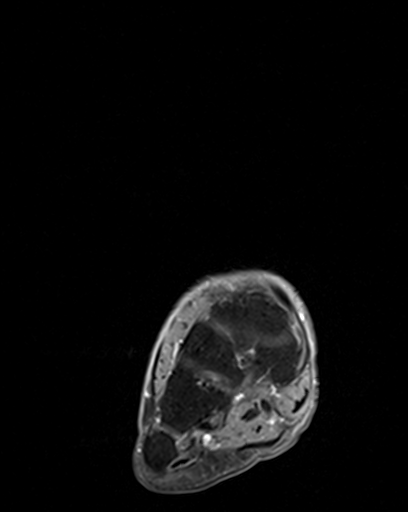
[im 26/43]
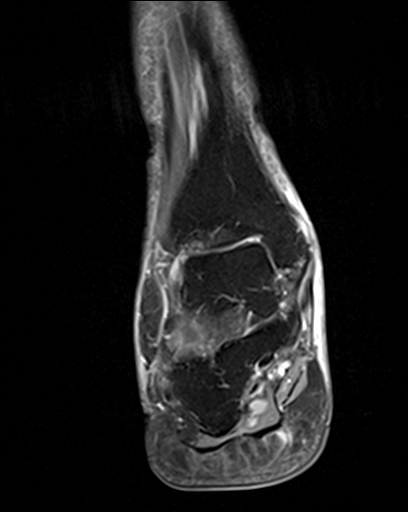
[im 34/43]
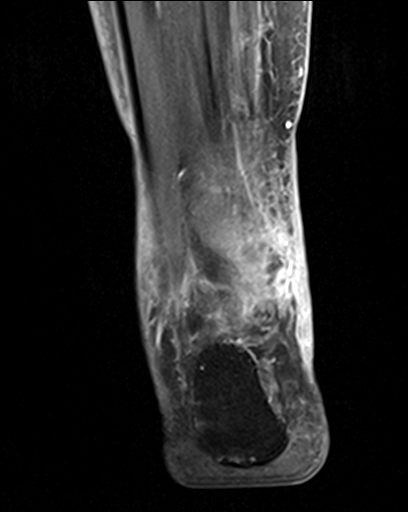

[39 of 40 positions shown; findings below may reference images not displayed]

FINDINGS: TENDONS

Peroneal: Long segment split tear of the retromalleolar and
inframalleolar aspects of the peroneus brevis tendon. Intact
peroneus longus. No tenosynovitis.

Posteromedial: Intact tibialis posterior, flexor hallucis longus and
flexor digitorum longus tendons.

Anterior: Intact tibialis anterior, extensor hallucis longus and
extensor digitorum longus tendons.

Achilles: Complete rupture of the distal Achilles tendon
approximately 1.0 cm proximal to its insertion. The proximal aspect
of the torn tendon is retracted up to 3.6 cm with complex fluid and
hemorrhage in the tendinous gap. The torn retracted portion of the
tendon is significantly thickened compatible with underlying
tendinosis. No evidence of bony avulsion.

Plantar Fascia: Intact.

LIGAMENTS

Lateral: The anterior and posterior tibiofibular ligaments are
intact. The anterior and posterior talofibular ligaments are intact,
however the anterior talofibular ligament is thickened suggesting
sequela of remote trauma. Intact calcaneofibular ligament.

Medial: Deltoid ligament and spring ligament complex intact.

CARTILAGE

Ankle Joint: No joint effusion or chondral defect.

Subtalar Joints/Sinus Tarsi: No joint effusion or chondral defect.
Preservation of the anatomic fat within the sinus tarsi.

Bones: No marrow signal abnormality. Specifically, no fracture or
marrow edema at the posterior calcaneal tuberosity. No fracture or
dislocation.

Other: Mild nonspecific subcutaneous edema at the distal lower leg
and ankle.
IMPRESSION: IMPRESSION
1. Acute to subacute appearing complete rupture of the distal
Achilles tendon 1.0 cm proximal to its insertion. The proximal
aspect of the torn tendon is retracted up to 3.6 cm with complex
fluid and hemorrhage in the tendinous gap. Background of advanced
Achilles tendinosis.
2. Long segment split tear of the retromalleolar and inframalleolar
aspects of the peroneus brevis tendon.

## 2021-03-17 DIAGNOSIS — L409 Psoriasis, unspecified: Secondary | ICD-10-CM | POA: Diagnosis not present

## 2021-03-17 DIAGNOSIS — M2550 Pain in unspecified joint: Secondary | ICD-10-CM | POA: Diagnosis not present

## 2021-03-17 DIAGNOSIS — Z79899 Other long term (current) drug therapy: Secondary | ICD-10-CM | POA: Diagnosis not present

## 2021-03-17 DIAGNOSIS — M150 Primary generalized (osteo)arthritis: Secondary | ICD-10-CM | POA: Diagnosis not present

## 2021-03-17 DIAGNOSIS — M154 Erosive (osteo)arthritis: Secondary | ICD-10-CM | POA: Diagnosis not present

## 2021-03-17 DIAGNOSIS — Z6823 Body mass index (BMI) 23.0-23.9, adult: Secondary | ICD-10-CM | POA: Diagnosis not present

## 2021-03-30 DIAGNOSIS — M8588 Other specified disorders of bone density and structure, other site: Secondary | ICD-10-CM | POA: Diagnosis not present

## 2021-03-30 DIAGNOSIS — N958 Other specified menopausal and perimenopausal disorders: Secondary | ICD-10-CM | POA: Diagnosis not present

## 2021-03-30 DIAGNOSIS — Z6823 Body mass index (BMI) 23.0-23.9, adult: Secondary | ICD-10-CM | POA: Diagnosis not present

## 2021-03-30 DIAGNOSIS — Z124 Encounter for screening for malignant neoplasm of cervix: Secondary | ICD-10-CM | POA: Diagnosis not present

## 2021-03-30 DIAGNOSIS — Z1231 Encounter for screening mammogram for malignant neoplasm of breast: Secondary | ICD-10-CM | POA: Diagnosis not present

## 2021-03-31 DIAGNOSIS — Z01419 Encounter for gynecological examination (general) (routine) without abnormal findings: Secondary | ICD-10-CM | POA: Diagnosis not present

## 2021-04-02 DIAGNOSIS — K219 Gastro-esophageal reflux disease without esophagitis: Secondary | ICD-10-CM | POA: Diagnosis not present

## 2021-04-02 DIAGNOSIS — I10 Essential (primary) hypertension: Secondary | ICD-10-CM | POA: Diagnosis not present

## 2021-04-02 DIAGNOSIS — J449 Chronic obstructive pulmonary disease, unspecified: Secondary | ICD-10-CM | POA: Diagnosis not present

## 2021-04-02 DIAGNOSIS — J45909 Unspecified asthma, uncomplicated: Secondary | ICD-10-CM | POA: Diagnosis not present

## 2021-04-02 DIAGNOSIS — F325 Major depressive disorder, single episode, in full remission: Secondary | ICD-10-CM | POA: Diagnosis not present

## 2021-04-02 DIAGNOSIS — E039 Hypothyroidism, unspecified: Secondary | ICD-10-CM | POA: Diagnosis not present

## 2021-04-02 DIAGNOSIS — E7800 Pure hypercholesterolemia, unspecified: Secondary | ICD-10-CM | POA: Diagnosis not present

## 2021-04-02 DIAGNOSIS — J4540 Moderate persistent asthma, uncomplicated: Secondary | ICD-10-CM | POA: Diagnosis not present

## 2021-04-02 DIAGNOSIS — M1990 Unspecified osteoarthritis, unspecified site: Secondary | ICD-10-CM | POA: Diagnosis not present

## 2021-04-29 DIAGNOSIS — F419 Anxiety disorder, unspecified: Secondary | ICD-10-CM | POA: Diagnosis not present

## 2021-04-29 DIAGNOSIS — F325 Major depressive disorder, single episode, in full remission: Secondary | ICD-10-CM | POA: Diagnosis not present

## 2021-04-29 DIAGNOSIS — J449 Chronic obstructive pulmonary disease, unspecified: Secondary | ICD-10-CM | POA: Diagnosis not present

## 2021-04-29 DIAGNOSIS — E039 Hypothyroidism, unspecified: Secondary | ICD-10-CM | POA: Diagnosis not present

## 2021-04-29 DIAGNOSIS — E7800 Pure hypercholesterolemia, unspecified: Secondary | ICD-10-CM | POA: Diagnosis not present

## 2021-04-29 DIAGNOSIS — G43909 Migraine, unspecified, not intractable, without status migrainosus: Secondary | ICD-10-CM | POA: Diagnosis not present

## 2021-04-29 DIAGNOSIS — J4540 Moderate persistent asthma, uncomplicated: Secondary | ICD-10-CM | POA: Diagnosis not present

## 2021-04-29 DIAGNOSIS — M154 Erosive (osteo)arthritis: Secondary | ICD-10-CM | POA: Diagnosis not present

## 2021-04-29 DIAGNOSIS — M1990 Unspecified osteoarthritis, unspecified site: Secondary | ICD-10-CM | POA: Diagnosis not present

## 2021-07-29 DIAGNOSIS — Z0000 Encounter for general adult medical examination without abnormal findings: Secondary | ICD-10-CM | POA: Diagnosis not present

## 2021-07-29 DIAGNOSIS — Z1389 Encounter for screening for other disorder: Secondary | ICD-10-CM | POA: Diagnosis not present

## 2021-09-16 DIAGNOSIS — Z6823 Body mass index (BMI) 23.0-23.9, adult: Secondary | ICD-10-CM | POA: Diagnosis not present

## 2021-09-16 DIAGNOSIS — M154 Erosive (osteo)arthritis: Secondary | ICD-10-CM | POA: Diagnosis not present

## 2021-09-16 DIAGNOSIS — M2550 Pain in unspecified joint: Secondary | ICD-10-CM | POA: Diagnosis not present

## 2021-09-16 DIAGNOSIS — M150 Primary generalized (osteo)arthritis: Secondary | ICD-10-CM | POA: Diagnosis not present

## 2021-09-16 DIAGNOSIS — Z79899 Other long term (current) drug therapy: Secondary | ICD-10-CM | POA: Diagnosis not present

## 2021-09-16 DIAGNOSIS — L409 Psoriasis, unspecified: Secondary | ICD-10-CM | POA: Diagnosis not present

## 2021-10-25 DIAGNOSIS — R059 Cough, unspecified: Secondary | ICD-10-CM | POA: Diagnosis not present

## 2021-10-25 DIAGNOSIS — J111 Influenza due to unidentified influenza virus with other respiratory manifestations: Secondary | ICD-10-CM | POA: Diagnosis not present

## 2021-11-04 DIAGNOSIS — I129 Hypertensive chronic kidney disease with stage 1 through stage 4 chronic kidney disease, or unspecified chronic kidney disease: Secondary | ICD-10-CM | POA: Diagnosis not present

## 2021-11-04 DIAGNOSIS — F419 Anxiety disorder, unspecified: Secondary | ICD-10-CM | POA: Diagnosis not present

## 2021-11-04 DIAGNOSIS — I10 Essential (primary) hypertension: Secondary | ICD-10-CM | POA: Diagnosis not present

## 2021-11-04 DIAGNOSIS — E7800 Pure hypercholesterolemia, unspecified: Secondary | ICD-10-CM | POA: Diagnosis not present

## 2021-11-04 DIAGNOSIS — G43909 Migraine, unspecified, not intractable, without status migrainosus: Secondary | ICD-10-CM | POA: Diagnosis not present

## 2021-11-04 DIAGNOSIS — E039 Hypothyroidism, unspecified: Secondary | ICD-10-CM | POA: Diagnosis not present

## 2021-11-04 DIAGNOSIS — M154 Erosive (osteo)arthritis: Secondary | ICD-10-CM | POA: Diagnosis not present

## 2021-11-04 DIAGNOSIS — N1831 Chronic kidney disease, stage 3a: Secondary | ICD-10-CM | POA: Diagnosis not present

## 2021-11-25 DIAGNOSIS — R059 Cough, unspecified: Secondary | ICD-10-CM | POA: Diagnosis not present

## 2021-11-25 DIAGNOSIS — R112 Nausea with vomiting, unspecified: Secondary | ICD-10-CM | POA: Diagnosis not present

## 2021-11-25 DIAGNOSIS — U071 COVID-19: Secondary | ICD-10-CM | POA: Diagnosis not present

## 2021-11-25 DIAGNOSIS — R52 Pain, unspecified: Secondary | ICD-10-CM | POA: Diagnosis not present

## 2022-02-23 DIAGNOSIS — H43813 Vitreous degeneration, bilateral: Secondary | ICD-10-CM | POA: Diagnosis not present

## 2022-02-23 DIAGNOSIS — H524 Presbyopia: Secondary | ICD-10-CM | POA: Diagnosis not present

## 2022-02-23 DIAGNOSIS — H04123 Dry eye syndrome of bilateral lacrimal glands: Secondary | ICD-10-CM | POA: Diagnosis not present

## 2022-03-03 DIAGNOSIS — H52222 Regular astigmatism, left eye: Secondary | ICD-10-CM | POA: Diagnosis not present

## 2022-03-03 DIAGNOSIS — Z961 Presence of intraocular lens: Secondary | ICD-10-CM | POA: Diagnosis not present

## 2022-03-03 DIAGNOSIS — H524 Presbyopia: Secondary | ICD-10-CM | POA: Diagnosis not present

## 2022-03-08 DIAGNOSIS — Z79899 Other long term (current) drug therapy: Secondary | ICD-10-CM | POA: Diagnosis not present

## 2022-03-08 DIAGNOSIS — M154 Erosive (osteo)arthritis: Secondary | ICD-10-CM | POA: Diagnosis not present

## 2022-03-08 DIAGNOSIS — M150 Primary generalized (osteo)arthritis: Secondary | ICD-10-CM | POA: Diagnosis not present

## 2022-03-08 DIAGNOSIS — L409 Psoriasis, unspecified: Secondary | ICD-10-CM | POA: Diagnosis not present

## 2022-03-08 DIAGNOSIS — M1991 Primary osteoarthritis, unspecified site: Secondary | ICD-10-CM | POA: Diagnosis not present

## 2022-03-08 DIAGNOSIS — Z6822 Body mass index (BMI) 22.0-22.9, adult: Secondary | ICD-10-CM | POA: Diagnosis not present

## 2022-04-27 DIAGNOSIS — Z01419 Encounter for gynecological examination (general) (routine) without abnormal findings: Secondary | ICD-10-CM | POA: Diagnosis not present

## 2022-04-27 DIAGNOSIS — Z6822 Body mass index (BMI) 22.0-22.9, adult: Secondary | ICD-10-CM | POA: Diagnosis not present

## 2022-04-27 DIAGNOSIS — Z1231 Encounter for screening mammogram for malignant neoplasm of breast: Secondary | ICD-10-CM | POA: Diagnosis not present

## 2022-05-10 DIAGNOSIS — S51812A Laceration without foreign body of left forearm, initial encounter: Secondary | ICD-10-CM | POA: Diagnosis not present

## 2022-06-28 DIAGNOSIS — E039 Hypothyroidism, unspecified: Secondary | ICD-10-CM | POA: Diagnosis not present

## 2022-06-28 DIAGNOSIS — L309 Dermatitis, unspecified: Secondary | ICD-10-CM | POA: Diagnosis not present

## 2022-06-28 DIAGNOSIS — Z6822 Body mass index (BMI) 22.0-22.9, adult: Secondary | ICD-10-CM | POA: Diagnosis not present

## 2022-06-28 DIAGNOSIS — R195 Other fecal abnormalities: Secondary | ICD-10-CM | POA: Diagnosis not present

## 2022-06-28 DIAGNOSIS — F419 Anxiety disorder, unspecified: Secondary | ICD-10-CM | POA: Diagnosis not present

## 2022-06-28 DIAGNOSIS — G43909 Migraine, unspecified, not intractable, without status migrainosus: Secondary | ICD-10-CM | POA: Diagnosis not present

## 2022-06-28 DIAGNOSIS — I10 Essential (primary) hypertension: Secondary | ICD-10-CM | POA: Diagnosis not present

## 2022-06-28 DIAGNOSIS — E7800 Pure hypercholesterolemia, unspecified: Secondary | ICD-10-CM | POA: Diagnosis not present

## 2022-07-06 DIAGNOSIS — R195 Other fecal abnormalities: Secondary | ICD-10-CM | POA: Diagnosis not present

## 2022-07-30 DIAGNOSIS — J449 Chronic obstructive pulmonary disease, unspecified: Secondary | ICD-10-CM | POA: Diagnosis not present

## 2022-07-30 DIAGNOSIS — Z23 Encounter for immunization: Secondary | ICD-10-CM | POA: Diagnosis not present

## 2022-07-30 DIAGNOSIS — Z0000 Encounter for general adult medical examination without abnormal findings: Secondary | ICD-10-CM | POA: Diagnosis not present

## 2022-07-30 DIAGNOSIS — Z1389 Encounter for screening for other disorder: Secondary | ICD-10-CM | POA: Diagnosis not present

## 2022-09-07 DIAGNOSIS — M154 Erosive (osteo)arthritis: Secondary | ICD-10-CM | POA: Diagnosis not present

## 2022-09-07 DIAGNOSIS — Z6823 Body mass index (BMI) 23.0-23.9, adult: Secondary | ICD-10-CM | POA: Diagnosis not present

## 2022-09-07 DIAGNOSIS — Z79899 Other long term (current) drug therapy: Secondary | ICD-10-CM | POA: Diagnosis not present

## 2022-09-07 DIAGNOSIS — L989 Disorder of the skin and subcutaneous tissue, unspecified: Secondary | ICD-10-CM | POA: Diagnosis not present

## 2022-09-07 DIAGNOSIS — L409 Psoriasis, unspecified: Secondary | ICD-10-CM | POA: Diagnosis not present

## 2022-09-21 DIAGNOSIS — L2089 Other atopic dermatitis: Secondary | ICD-10-CM | POA: Diagnosis not present

## 2022-10-05 DIAGNOSIS — E039 Hypothyroidism, unspecified: Secondary | ICD-10-CM | POA: Diagnosis not present

## 2022-10-12 DIAGNOSIS — D0462 Carcinoma in situ of skin of left upper limb, including shoulder: Secondary | ICD-10-CM | POA: Diagnosis not present

## 2022-10-12 DIAGNOSIS — D485 Neoplasm of uncertain behavior of skin: Secondary | ICD-10-CM | POA: Diagnosis not present

## 2022-10-28 DIAGNOSIS — D0462 Carcinoma in situ of skin of left upper limb, including shoulder: Secondary | ICD-10-CM | POA: Diagnosis not present

## 2022-12-10 DIAGNOSIS — E7800 Pure hypercholesterolemia, unspecified: Secondary | ICD-10-CM | POA: Diagnosis not present

## 2022-12-10 DIAGNOSIS — E039 Hypothyroidism, unspecified: Secondary | ICD-10-CM | POA: Diagnosis not present

## 2022-12-10 DIAGNOSIS — N1831 Chronic kidney disease, stage 3a: Secondary | ICD-10-CM | POA: Diagnosis not present

## 2022-12-10 DIAGNOSIS — I129 Hypertensive chronic kidney disease with stage 1 through stage 4 chronic kidney disease, or unspecified chronic kidney disease: Secondary | ICD-10-CM | POA: Diagnosis not present

## 2022-12-10 DIAGNOSIS — C4490 Unspecified malignant neoplasm of skin, unspecified: Secondary | ICD-10-CM | POA: Diagnosis not present

## 2022-12-10 DIAGNOSIS — G43909 Migraine, unspecified, not intractable, without status migrainosus: Secondary | ICD-10-CM | POA: Diagnosis not present

## 2022-12-10 DIAGNOSIS — Z23 Encounter for immunization: Secondary | ICD-10-CM | POA: Diagnosis not present

## 2022-12-10 DIAGNOSIS — F419 Anxiety disorder, unspecified: Secondary | ICD-10-CM | POA: Diagnosis not present

## 2022-12-10 DIAGNOSIS — Z6825 Body mass index (BMI) 25.0-25.9, adult: Secondary | ICD-10-CM | POA: Diagnosis not present

## 2022-12-21 DIAGNOSIS — D0462 Carcinoma in situ of skin of left upper limb, including shoulder: Secondary | ICD-10-CM | POA: Diagnosis not present

## 2022-12-27 DIAGNOSIS — L905 Scar conditions and fibrosis of skin: Secondary | ICD-10-CM | POA: Diagnosis not present

## 2023-01-04 DIAGNOSIS — L905 Scar conditions and fibrosis of skin: Secondary | ICD-10-CM | POA: Diagnosis not present

## 2023-01-11 DIAGNOSIS — B999 Unspecified infectious disease: Secondary | ICD-10-CM | POA: Diagnosis not present

## 2023-01-18 DIAGNOSIS — L905 Scar conditions and fibrosis of skin: Secondary | ICD-10-CM | POA: Diagnosis not present

## 2023-02-21 DIAGNOSIS — L905 Scar conditions and fibrosis of skin: Secondary | ICD-10-CM | POA: Diagnosis not present

## 2023-03-01 DIAGNOSIS — K5090 Crohn's disease, unspecified, without complications: Secondary | ICD-10-CM | POA: Diagnosis not present

## 2023-03-01 DIAGNOSIS — H524 Presbyopia: Secondary | ICD-10-CM | POA: Diagnosis not present

## 2023-03-01 DIAGNOSIS — H43813 Vitreous degeneration, bilateral: Secondary | ICD-10-CM | POA: Diagnosis not present

## 2023-03-01 DIAGNOSIS — H04123 Dry eye syndrome of bilateral lacrimal glands: Secondary | ICD-10-CM | POA: Diagnosis not present

## 2023-03-01 DIAGNOSIS — K5190 Ulcerative colitis, unspecified, without complications: Secondary | ICD-10-CM | POA: Diagnosis not present

## 2023-03-09 DIAGNOSIS — E663 Overweight: Secondary | ICD-10-CM | POA: Diagnosis not present

## 2023-03-09 DIAGNOSIS — Z6825 Body mass index (BMI) 25.0-25.9, adult: Secondary | ICD-10-CM | POA: Diagnosis not present

## 2023-03-09 DIAGNOSIS — M79642 Pain in left hand: Secondary | ICD-10-CM | POA: Diagnosis not present

## 2023-03-09 DIAGNOSIS — Z79899 Other long term (current) drug therapy: Secondary | ICD-10-CM | POA: Diagnosis not present

## 2023-03-09 DIAGNOSIS — L409 Psoriasis, unspecified: Secondary | ICD-10-CM | POA: Diagnosis not present

## 2023-03-09 DIAGNOSIS — M79641 Pain in right hand: Secondary | ICD-10-CM | POA: Diagnosis not present

## 2023-03-09 DIAGNOSIS — M154 Erosive (osteo)arthritis: Secondary | ICD-10-CM | POA: Diagnosis not present

## 2023-03-09 DIAGNOSIS — J449 Chronic obstructive pulmonary disease, unspecified: Secondary | ICD-10-CM | POA: Diagnosis not present

## 2023-03-09 DIAGNOSIS — M064 Inflammatory polyarthropathy: Secondary | ICD-10-CM | POA: Diagnosis not present

## 2023-03-22 DIAGNOSIS — D692 Other nonthrombocytopenic purpura: Secondary | ICD-10-CM | POA: Diagnosis not present

## 2023-03-22 DIAGNOSIS — D1801 Hemangioma of skin and subcutaneous tissue: Secondary | ICD-10-CM | POA: Diagnosis not present

## 2023-03-22 DIAGNOSIS — Z86008 Personal history of in-situ neoplasm of other site: Secondary | ICD-10-CM | POA: Diagnosis not present

## 2023-03-22 DIAGNOSIS — L814 Other melanin hyperpigmentation: Secondary | ICD-10-CM | POA: Diagnosis not present

## 2023-03-22 DIAGNOSIS — L570 Actinic keratosis: Secondary | ICD-10-CM | POA: Diagnosis not present

## 2023-03-30 ENCOUNTER — Encounter (HOSPITAL_BASED_OUTPATIENT_CLINIC_OR_DEPARTMENT_OTHER): Payer: Self-pay | Admitting: Radiology

## 2023-03-30 ENCOUNTER — Other Ambulatory Visit: Payer: Self-pay

## 2023-03-30 ENCOUNTER — Emergency Department (HOSPITAL_BASED_OUTPATIENT_CLINIC_OR_DEPARTMENT_OTHER): Payer: Medicare HMO

## 2023-03-30 ENCOUNTER — Emergency Department (HOSPITAL_BASED_OUTPATIENT_CLINIC_OR_DEPARTMENT_OTHER)
Admission: EM | Admit: 2023-03-30 | Discharge: 2023-03-30 | Disposition: A | Discharge status: Home / Self Care | Payer: Medicare HMO | Attending: Emergency Medicine | Admitting: Emergency Medicine

## 2023-03-30 DIAGNOSIS — W19XXXA Unspecified fall, initial encounter: Secondary | ICD-10-CM | POA: Insufficient documentation

## 2023-03-30 DIAGNOSIS — S0011XA Contusion of right eyelid and periocular area, initial encounter: Secondary | ICD-10-CM | POA: Diagnosis not present

## 2023-03-30 DIAGNOSIS — Z7951 Long term (current) use of inhaled steroids: Secondary | ICD-10-CM | POA: Insufficient documentation

## 2023-03-30 DIAGNOSIS — J45909 Unspecified asthma, uncomplicated: Secondary | ICD-10-CM | POA: Insufficient documentation

## 2023-03-30 DIAGNOSIS — E039 Hypothyroidism, unspecified: Secondary | ICD-10-CM | POA: Insufficient documentation

## 2023-03-30 DIAGNOSIS — I10 Essential (primary) hypertension: Secondary | ICD-10-CM | POA: Diagnosis not present

## 2023-03-30 DIAGNOSIS — S51011A Laceration without foreign body of right elbow, initial encounter: Secondary | ICD-10-CM | POA: Insufficient documentation

## 2023-03-30 DIAGNOSIS — Z043 Encounter for examination and observation following other accident: Secondary | ICD-10-CM | POA: Diagnosis not present

## 2023-03-30 DIAGNOSIS — Z79899 Other long term (current) drug therapy: Secondary | ICD-10-CM | POA: Diagnosis not present

## 2023-03-30 DIAGNOSIS — S60512A Abrasion of left hand, initial encounter: Secondary | ICD-10-CM | POA: Insufficient documentation

## 2023-03-30 DIAGNOSIS — S0083XA Contusion of other part of head, initial encounter: Secondary | ICD-10-CM

## 2023-03-30 DIAGNOSIS — S81011A Laceration without foreign body, right knee, initial encounter: Secondary | ICD-10-CM | POA: Diagnosis not present

## 2023-03-30 DIAGNOSIS — R519 Headache, unspecified: Secondary | ICD-10-CM | POA: Diagnosis not present

## 2023-03-30 MED ORDER — LIDOCAINE-EPINEPHRINE 2 %-1:100000 IJ SOLN: 10.0000 mL | Freq: Once | INTRAMUSCULAR | Status: DC

## 2023-03-30 MED ORDER — LIDOCAINE-EPINEPHRINE (PF) 2 %-1:200000 IJ SOLN
20.0000 mL | Freq: Once | INTRAMUSCULAR | Status: AC
  Administered 2023-03-30: 20 mL
  Filled 2023-03-30: qty 20

## 2023-03-30 NOTE — ED Notes (Signed)
Provided wound care, bacitracin on wounds, wrapped with kerlix, applied knee brace and verablized understanding of directions.

## 2023-03-30 NOTE — ED Provider Notes (Signed)
[place] [facility] Provider Note   CSN: [id number] Arrival date & time: 03/30/23  1826     History  Chief Complaint  Patient presents with   [name] is a 77 y.o. female.  The history is provided by the patient, medical records and the spouse. No language interpreter was used.  Fall This is a new problem. The current episode started 1 to 2 hours ago. The problem occurs rarely. The problem has not changed since onset.Pertinent negatives include no chest pain, no abdominal pain, no headaches and no shortness of breath. Nothing aggravates the symptoms. Nothing relieves the symptoms. She has tried nothing for the symptoms. The treatment provided no relief.       Home Medications Prior to Admission medications   Medication Sig Start Date End Date Taking? Authorizing Provider  amLODipine (NORVASC) 5 MG tablet Take 5 mg by mouth daily.    [provider]  budesonide-formoterol (SYMBICORT) 80-4.5 MCG/ACT inhaler Inhale 2 puffs into the lungs 2 (two) times daily as needed (shortness of breath).    [provider]  calcium-vitamin D (OSCAL WITH D) 500-200 MG-UNIT per tablet Take 1 tablet by mouth daily.    [provider]  esomeprazole (NEXIUM) 20 MG capsule Take 20 mg by mouth daily at 12 noon.    [provider]  hydroxychloroquine (PLAQUENIL) 200 MG tablet Take 200 mg by mouth 2 (two) times daily.  06/25/20   [provider]  levETIRAcetam (KEPPRA) 500 MG tablet Take 500 mg by mouth 2 (two) times daily.     [provider]  levothyroxine (SYNTHROID, LEVOTHROID) 100 MCG tablet Take 100 mcg by mouth daily.    [provider]  meloxicam (MOBIC) 15 MG tablet Take 15 mg by mouth daily.    [provider]  Polyethyl Glycol-Propyl Glycol (SYSTANE OP) Place 1 drop into both eyes daily as needed (dry eyes).    [provider]  rosuvastatin (CRESTOR) 5 MG tablet Take  5 mg by mouth 2 (two) times a week. [employer], Historical, MD  venlafaxine XR (EFFEXOR-XR) 37.5 MG 24 hr capsule Take 37.5 mg by mouth daily with breakfast.    [provider]      Allergies    Doxycycline, Prednisone, and Cleocin [clindamycin hcl]    Review of Systems   Review of Systems  Constitutional:  Negative for chills, fatigue and fever.  HENT:  Negative for congestion.   Eyes:  Negative for visual disturbance.  Respiratory:  Negative for cough, chest tightness and shortness of breath.   Cardiovascular:  Negative for chest pain.  Gastrointestinal:  Negative for abdominal pain, constipation, diarrhea, nausea and vomiting.  Genitourinary:  Negative for dysuria.  Musculoskeletal:  Negative for back pain, neck pain and neck stiffness.  Skin:  Positive for wound.  Neurological:  Negative for weakness, light-headedness, numbness and headaches.  Psychiatric/Behavioral:  Negative for agitation and confusion.   All other systems reviewed and are negative.   Physical Exam Updated Vital Signs BP 111/63 (BP Location: Left Arm)   Pulse 91   Temp 98 F (36.7 C)   Resp 20   Ht 5\' 4"  (1.626 m)   Wt 63.5 kg   SpO2 100%   BMI 24.03 kg/m  Physical Exam Vitals and nursing note reviewed.  Constitutional:      General: She is not in acute distress.    Appearance: She is  well-developed. She is not ill-appearing, toxic-appearing or diaphoretic.  HENT:     Nose: No congestion or rhinorrhea.     Mouth/Throat:     Mouth: Mucous membranes are moist.     Pharynx: No oropharyngeal exudate or posterior oropharyngeal erythema.  Eyes:     General: No scleral icterus.    Extraocular Movements: Extraocular movements intact.     Conjunctiva/sclera: Conjunctivae normal.     Pupils: Pupils are equal, round, and reactive to light.  Cardiovascular:     Rate and Rhythm: Normal rate and regular rhythm.     Heart sounds: No murmur heard. Pulmonary:     Effort: Pulmonary  effort is normal. No respiratory distress.     Breath sounds: Normal breath sounds. No wheezing, rhonchi or rales.  Chest:     Chest wall: No tenderness.  Abdominal:     General: Abdomen is flat.     Palpations: Abdomen is soft.     Tenderness: There is no abdominal tenderness. There is no guarding or rebound.  Musculoskeletal:        General: Tenderness and signs of injury present. No swelling.     Cervical back: Neck supple. No tenderness.     Comments: Bruising to right eyebrow with small hematoma.  No crepitance.  Normal extraocular movements.  Abrasion to left hypothenar eminence without significant tenderness.  No snuffbox tenderness.  Skin tears to right elbow that are hemostatic.  Minimal tenderness.  Intact sensation and strength, and pulses.    8 cm laceration to anterior right lower leg just below the knee.  Skin:    General: Skin is warm and dry.     Capillary Refill: Capillary refill takes less than 2 seconds.     Findings: Bruising present.  Neurological:     General: No focal deficit present.     Mental Status: She is alert.     Sensory: No sensory deficit.     Motor: No weakness.  Psychiatric:        Mood and Affect: Mood normal.     ED Results / Procedures / Treatments   Labs (all labs ordered are listed, but only abnormal results are displayed) Labs Reviewed - No data to display  EKG None  Radiology CT MAXILLOFACIAL WO CONTRAST  Result Date: 03/30/2023 CLINICAL DATA:  Trauma, pain EXAM: CT MAXILLOFACIAL WITHOUT CONTRAST TECHNIQUE: Multidetector CT imaging of the maxillofacial structures was performed. Multiplanar CT image reconstructions were also generated. RADIATION DOSE REDUCTION: This exam was performed according to the departmental dose-optimization program which includes automated exposure control, adjustment of the mA and/or kV according to patient size and/or use of iterative reconstruction technique. COMPARISON:  None Available. FINDINGS: Osseous:  No recent fracture is seen. There is deviation of nasal septum to the left. Degenerative changes are noted in cervical spine with small anterior bony spurs. There is partial fusion of C2 and C3 vertebrae. Bony spurs are noted had articulation of anterior arch of C1 and odontoid process. Orbits: Optic globes are symmetrical. Retrobulbar soft tissues are unremarkable. There is no evidence of blowout fracture. Sinuses: There are no air-fluid levels. There is mild mucosal thickening in ethmoid and sphenoid sinuses. Soft tissues: No significant abnormalities are seen. Limited intracranial: Unremarkable. IMPRESSION: No acute findings are seen in facial bones. Mild chronic sinusitis.  Cervical spondylosis. Electronically Signed   By: Ernie Avena M.D.   On: 03/30/2023 19:40   DG Elbow Complete Right  Result Date: 03/30/2023 CLINICAL DATA:  Trauma, fall  EXAM: RIGHT ELBOW - COMPLETE 3+ VIEW COMPARISON:  None Available. FINDINGS: No recent fracture or dislocation is seen. There is no displacement of posterior fat pad. Small smoothly marginated calcification adjacent to the medial epicondyle of distal humerus may be residual from previous injury. There is minimal cortical irregularity in the lateral epicondyle of distal humerus, possibly residual from previous injury. IMPRESSION: No recent fracture or dislocation is seen in right elbow. Electronically Signed   By: Ernie Avena M.D.   On: 03/30/2023 19:33   DG Knee Complete 4 Views Right  Result Date: 03/30/2023 CLINICAL DATA:  Trauma, fall EXAM: RIGHT KNEE - COMPLETE 4+ VIEW COMPARISON:  None Available. FINDINGS: No recent fracture or dislocation is seen. There is no effusion in the suprapatellar bursa. Small bony spurs are seen in medial and patellofemoral compartments. There is a 4 mm smoothly marginated calcification adjacent to the posteromedial aspect of the proximal tibia, possibly residual from previous ligament injury. IMPRESSION: No recent fracture  or dislocation is seen in right knee. Small bony spurs are seen in medial and patellofemoral compartments. Electronically Signed   By: Ernie Avena M.D.   On: 03/30/2023 19:31    Procedures .Marland KitchenLaceration Repair  Date/Time: 03/30/2023 11:47 PM  Performed by: Heide Scales, MD Authorized by: Heide Scales, MD   Consent:    Consent obtained:  Verbal   Consent given by:  Patient   Risks, benefits, and alternatives were discussed: yes     Risks discussed:  Infection, pain, poor cosmetic result, poor wound healing and need for additional repair   Alternatives discussed:  No treatment Universal protocol:    Immediately prior to procedure, a time out was called: yes     Patient identity confirmed:  Verbally with patient Anesthesia:    Anesthesia method:  Local infiltration   Local anesthetic:  Lidocaine 2% WITH epi Laceration details:    Location:  Leg   Leg location:  R knee   Length (cm):  8   Depth (mm):  4 Pre-procedure details:    Preparation:  Patient was prepped and draped in usual sterile fashion and imaging obtained to evaluate for foreign bodies Exploration:    Limited defect created (wound extended): no     Imaging obtained: x-ray     Imaging outcome: foreign body not noted     Wound exploration: wound explored through full range of motion and entire depth of wound visualized     Wound extent: no foreign body, no underlying fracture and no vascular damage     Contaminated: no   Treatment:    Area cleansed with:  Saline, Shur-Clens and chlorhexidine   Amount of cleaning:  Extensive   Irrigation solution:  Sterile saline   Irrigation method:  Syringe   Visualized foreign bodies/material removed: no     Debridement:  None   Undermining:  None Skin repair:    Repair method:  Sutures   Suture size:  4-0   Suture material:  Prolene   Suture technique:  Simple interrupted   Number of sutures:  10 Approximation:    Approximation:  Close Repair type:     Repair type:  Simple Post-procedure details:    Dressing:  Antibiotic ointment, non-adherent dressing and splint for protection   Procedure completion:  Tolerated     Medications Ordered in ED Medications - No data to display  ED Course/ Medical Decision Making/ A&P  Medical Decision Making Amount and/or Complexity of Data Reviewed Radiology: ordered.  Risk Prescription drug management.    [name] is a 77 y.o. female with a past medical history significant for previous Achilles tendon rupture, asthma, depression, arthritis, anxiety, hypertension, and hypothyroidism who presents with fall.  According to patient, this evening around 5:30 PM patient was struck by dog and fell the ground.  Patient reports that she struck her right forehead, right elbow, left hand, and right knee on the ground and sustained injuries.  She did not lose consciousness.  Denies significant headache nausea or vomiting.  Denies any neurologic complaints or any vision changes.  Denies any neck pain or neck stiffness and denies pain to the torso.  She reports she is not having significant pain in her left hand, just a scrape in the hypothenar eminence.  No pain in fingers.  She reports some pain in the elbow on her right side and then pain in the right knee that was bleeding.  On exam, patient has bruising and hematoma to her right eyebrow with no laceration.  There is an abrasion.  Pupils are symmetric and reactive with normal extract movements.  No crepitance.  No evidence of entrapment.  Symmetric smile and clear speech.  Neck nontender.  Normal range of motion.  No tenderness in the chest abdomen or back.  Lungs clear.  Abdomen nontender.  Good pulses in all extremities.  Patient has several small skin tears to her right elbow that are hemostatic and well-appearing.  Good pulses.  Patient has abrasion to her left hypothenar eminence with no snuffbox tenderness.  No crepitance or  significant wrist tenderness.  Patient has some tenderness to the anterior right knee where she has a laceration.  It is a shallow skiving laceration that is semicircular and does not appear to go very deep but it is gaping.  It is hemostatic now.  Distally she has no tenderness has intact sensation strength and pulses.  X-rays were obtained in triage including x-ray of the right elbow, right knee, and CT of the face.  No evidence of bony injury or evidence of foreign bodies at this time.  Skin tears will be dressed on the elbow but we will need to repair laceration on right knee.  Anticipate knee immobilization given the high stress area of the knee and will have her follow-up with PCP after repair.  Laceration was repaired without difficulty with 10 sutures.  Knee immobilizer was placed and patient will be discharged.  It was covered in bacitracin and she knows to watch for infection.  She understands return precautions and follow-up instructions and was discharged in good condition after reassuring workup.         Final Clinical Impression(s) / ED Diagnoses Final diagnoses:  Fall, initial encounter  Contusion of face, initial encounter  Skin tear of right elbow without complication, initial encounter  Laceration of right knee, initial encounter    Rx / DC Orders ED Discharge Orders     None       Clinical Impression: 1. Fall, initial encounter   2. Contusion of face, initial encounter   3. Skin tear of right elbow without complication, initial encounter   4. Laceration of right knee, initial encounter     Disposition: Discharge  Condition: Good  I have discussed the results, Dx and Tx plan with the pt(& family if present). He/she/they expressed understanding and agree(s) with the plan. Discharge instructions discussed at great length. Strict  return precautions discussed and pt &/or family have verbalized understanding of the instructions. No further questions at time of  discharge.    Discharge Medication List as of 03/30/2023 11:12 PM      Follow Up: Sigmund Hazel, MD [address] [place] [address] [phone]     [facility] Emergency Department at [facility] [address] [id number] YQ [facility] [place] [address] [phone]        Berda Shelvin, Canary Brim, MD 03/30/23 [phone]

## 2023-03-30 NOTE — Discharge Instructions (Signed)
Your history, exam, workup today showed no evidence of acute bony injury or facial fractures on the CT and x-rays.  He had skin tear to the elbow that was cleaned and dressed.  Your forehead just at the abrasion and hematoma which will be absorbed on its own.  The knee injury needed laceration repair with stitches.  The x-ray did not show foreign body and we cleaned it very well.  Please follow-up with your primary doctor and between 7 and 10 days for reevaluation and likely suture removal.  Please keep the knee immobilizer in place to help prevent the stitches from popping with tension.  Please watch for evidence of infection.  The rest of your exam was reassuring and we feel you are safe for discharge home.  If any symptoms change or worsen acutely, please return to the nearest emergency department.

## 2023-03-30 NOTE — ED Triage Notes (Signed)
Pt states she was knocked down by a lab dog. Pt has multiple small lacerations to her right elbow and about a 6-8 cm laceration to her right knee. Wound cleaned and wrapped. Bleeding controlled. Pt has bruise to her right lateral eye endorsing she hit her head on the bumper of the car. Denies LOC. Denies use of blood thinners. Pt complains of right shoulder pain and left hand pain.

## 2023-04-11 DIAGNOSIS — S81011S Laceration without foreign body, right knee, sequela: Secondary | ICD-10-CM | POA: Diagnosis not present

## 2023-04-11 DIAGNOSIS — Z6825 Body mass index (BMI) 25.0-25.9, adult: Secondary | ICD-10-CM | POA: Diagnosis not present

## 2023-04-11 DIAGNOSIS — L4050 Arthropathic psoriasis, unspecified: Secondary | ICD-10-CM | POA: Diagnosis not present

## 2023-04-11 DIAGNOSIS — W19XXXA Unspecified fall, initial encounter: Secondary | ICD-10-CM | POA: Diagnosis not present

## 2023-04-11 DIAGNOSIS — M154 Erosive (osteo)arthritis: Secondary | ICD-10-CM | POA: Diagnosis not present

## 2023-04-11 DIAGNOSIS — W19XXXD Unspecified fall, subsequent encounter: Secondary | ICD-10-CM | POA: Diagnosis not present

## 2023-04-11 DIAGNOSIS — L409 Psoriasis, unspecified: Secondary | ICD-10-CM | POA: Diagnosis not present

## 2023-04-11 DIAGNOSIS — Z6824 Body mass index (BMI) 24.0-24.9, adult: Secondary | ICD-10-CM | POA: Diagnosis not present

## 2023-04-11 DIAGNOSIS — S81011A Laceration without foreign body, right knee, initial encounter: Secondary | ICD-10-CM | POA: Diagnosis not present

## 2023-05-05 DIAGNOSIS — Z6824 Body mass index (BMI) 24.0-24.9, adult: Secondary | ICD-10-CM | POA: Diagnosis not present

## 2023-05-05 DIAGNOSIS — Z1272 Encounter for screening for malignant neoplasm of vagina: Secondary | ICD-10-CM | POA: Diagnosis not present

## 2023-05-05 DIAGNOSIS — Z124 Encounter for screening for malignant neoplasm of cervix: Secondary | ICD-10-CM | POA: Diagnosis not present

## 2023-05-05 DIAGNOSIS — Z1231 Encounter for screening mammogram for malignant neoplasm of breast: Secondary | ICD-10-CM | POA: Diagnosis not present

## 2023-06-21 ENCOUNTER — Ambulatory Visit: Payer: Medicare HMO | Admitting: Physician Assistant

## 2023-06-21 ENCOUNTER — Other Ambulatory Visit (INDEPENDENT_AMBULATORY_CARE_PROVIDER_SITE_OTHER): Payer: Medicare HMO

## 2023-06-21 ENCOUNTER — Encounter: Payer: Self-pay | Admitting: Physician Assistant

## 2023-06-21 DIAGNOSIS — M25511 Pain in right shoulder: Secondary | ICD-10-CM

## 2023-06-21 MED ORDER — LIDOCAINE HCL 1 % IJ SOLN
2.0000 mL | INTRAMUSCULAR | Status: AC | PRN
  Administered 2023-06-21: 2 mL

## 2023-06-21 MED ORDER — BUPIVACAINE HCL 0.25 % IJ SOLN
2.0000 mL | INTRAMUSCULAR | Status: AC | PRN
  Administered 2023-06-21: 2 mL via INTRA_ARTICULAR

## 2023-06-21 MED ORDER — METHYLPREDNISOLONE ACETATE 40 MG/ML IJ SUSP
60.0000 mg | INTRAMUSCULAR | Status: AC | PRN
  Administered 2023-06-21: 60 mg via INTRA_ARTICULAR

## 2023-06-21 NOTE — Progress Notes (Signed)
Office Visit Note   Patient: [name]           Date of Birth: [birth year]           MRN: [id number] Visit Date: 06/21/2023              Requested by: Sigmund Hazel, MD [address] [place],  [place] [address] PCP: Sigmund Hazel, MD   Assessment & Plan: Visit Diagnoses: Right shoulder pain  Plan: Patient has a history of right shoulder rotator cuff tear which was repaired by Dr. Cleophas Dunker in 2014.  She has had intermittent impingement findings and injections with him that have helped her.  The last from what I see was 2020 and she tolerated it well.  She has had no injury but she does have recurrence of some of her painful symptoms.  When comparing x-rays she does have more degenerative changes as well of the glenohumeral joint and this may be part of the problem as well.  I will start with a subacromial injection today.  She is going to see how this works.  Certainly if she had continued pain would arrange for her to get an ultrasound-guided glenohumeral injection  Follow-Up Instructions: No follow-ups on file.   Orders:  No orders of the defined types were placed in this encounter.  No orders of the defined types were placed in this encounter.     Procedures: Large Joint Inj: R subacromial bursa on 06/21/2023 1:49 PM Indications: diagnostic evaluation and pain Details: 25 G 1.5 in needle, posterior approach  Arthrogram: No  Medications: 60 mg methylPREDNISolone acetate 40 MG/ML; 2 mL lidocaine 1 %; 2 mL bupivacaine 0.25 % Outcome: tolerated well, no immediate complications Procedure, treatment alternatives, risks and benefits explained, specific risks discussed. Consent was given by the patient.       Clinical Data: No additional findings.   Subjective: No chief complaint on file.   HPI pleasant 77 year old woman comes in with a chief complaint of mild to moderate pain in her right shoulder.  Pain is over the superior aspect of her shoulder as well is deep  in the joint but says that the pain is most noticed in the [facility] joint area.  She has tried taking over-the-counter medication and using some Voltaren gel but still has pain.  She was wondering if an injection might help she has had that in the past with Dr. Cleophas Dunker and seem to help her quite a bit  Review of Systems  All other systems reviewed and are negative.    Objective: Vital Signs: There were no vitals taken for this visit.  Physical Exam Constitutional:      Appearance: Normal appearance.  Pulmonary:     Effort: Pulmonary effort is normal.  Skin:    General: Skin is warm and dry.  Neurological:     General: No focal deficit present.     Mental Status: She is alert.     Ortho Exam Right shoulder she is neurovascularly intact she has good grip strength good biceps triceps strength good strength with resisted abduction external rotation.  She does have forward elevation actively to almost 175 degrees.  She has positive impingement findings and empty can test.  Also has some stiffness and pain with external rotation no redness no erythema Specialty Comments:  No specialty comments available.  Imaging: No results found.   PMFS History: Patient Active Problem List   Diagnosis Date Noted   Chronic rupture of Achilles tendon  Achilles tendon rupture, right, initial encounter 09/18/2020   Degeneration of lumbar intervertebral disc 08/01/2018   Lumbar radiculopathy 08/01/2018   Past Medical History:  Diagnosis Date   Anxiety    Arthritis    Asthma    Depression    GERD (gastroesophageal reflux disease)    Headache(784.0)    Hypertension    Hypothyroidism    PONV (postoperative nausea and vomiting)    Wears glasses     History reviewed. No pertinent family history.  Past Surgical History:  Procedure Laterality Date   ABDOMINAL HYSTERECTOMY  1981   ACHILLES TENDON SURGERY Right 10/10/2020   Procedure: SECONDARY RECONSTRUCTION RIGHT ACHILLES;  Surgeon: Nadara Mustard, MD;  Location: [facility] OR;  Service: Orthopedics;  Laterality: Right;   APPENDECTOMY     age 77   CHOLECYSTECTOMY  1/10   lap choli   COLONOSCOPY     CYSTOCELE REPAIR  8/10   ant/post-rectocele/cystocele repair   OVARY SURGERY  1995   SHOULDER ARTHROSCOPY WITH ROTATOR CUFF REPAIR AND SUBACROMIAL DECOMPRESSION  12/12/2012   Procedure: SHOULDER ARTHROSCOPY WITH ROTATOR CUFF REPAIR AND SUBACROMIAL DECOMPRESSION;  Surgeon: Valeria Batman, MD;  Location: [facility];  Service: Orthopedics;  Laterality: Right;  Right Shoulder Mini Open Rotator Cuff Repair and Subacromial Decompression, possible Patch.   TONSILLECTOMY     age 53   TUBAL LIGATION  1976   Social History   Occupational History   Not on file  Tobacco Use   Smoking status: Former    Current packs/day: 0.00    Types: Cigarettes    Quit date: 12/07/1988    Years since quitting: 34.5   Smokeless tobacco: Never  Substance and Sexual Activity   Alcohol use: Yes    Comment: occ   Drug use: Never   Sexual activity: Not Currently

## 2023-07-12 DIAGNOSIS — L4050 Arthropathic psoriasis, unspecified: Secondary | ICD-10-CM | POA: Diagnosis not present

## 2023-07-12 DIAGNOSIS — L409 Psoriasis, unspecified: Secondary | ICD-10-CM | POA: Diagnosis not present

## 2023-07-12 DIAGNOSIS — M154 Erosive (osteo)arthritis: Secondary | ICD-10-CM | POA: Diagnosis not present

## 2023-07-12 DIAGNOSIS — Z6824 Body mass index (BMI) 24.0-24.9, adult: Secondary | ICD-10-CM | POA: Diagnosis not present

## 2023-07-12 DIAGNOSIS — Z79899 Other long term (current) drug therapy: Secondary | ICD-10-CM | POA: Diagnosis not present

## 2023-08-03 DIAGNOSIS — Z1389 Encounter for screening for other disorder: Secondary | ICD-10-CM | POA: Diagnosis not present

## 2023-08-03 DIAGNOSIS — Z0000 Encounter for general adult medical examination without abnormal findings: Secondary | ICD-10-CM | POA: Diagnosis not present

## 2023-08-17 DIAGNOSIS — M154 Erosive (osteo)arthritis: Secondary | ICD-10-CM | POA: Diagnosis not present

## 2023-08-17 DIAGNOSIS — I129 Hypertensive chronic kidney disease with stage 1 through stage 4 chronic kidney disease, or unspecified chronic kidney disease: Secondary | ICD-10-CM | POA: Diagnosis not present

## 2023-08-17 DIAGNOSIS — F325 Major depressive disorder, single episode, in full remission: Secondary | ICD-10-CM | POA: Diagnosis not present

## 2023-08-17 DIAGNOSIS — L4050 Arthropathic psoriasis, unspecified: Secondary | ICD-10-CM | POA: Diagnosis not present

## 2023-08-17 DIAGNOSIS — Z6824 Body mass index (BMI) 24.0-24.9, adult: Secondary | ICD-10-CM | POA: Diagnosis not present

## 2023-08-17 DIAGNOSIS — N1831 Chronic kidney disease, stage 3a: Secondary | ICD-10-CM | POA: Diagnosis not present

## 2023-08-17 DIAGNOSIS — E039 Hypothyroidism, unspecified: Secondary | ICD-10-CM | POA: Diagnosis not present

## 2023-08-17 DIAGNOSIS — F419 Anxiety disorder, unspecified: Secondary | ICD-10-CM | POA: Diagnosis not present

## 2023-08-17 DIAGNOSIS — Z23 Encounter for immunization: Secondary | ICD-10-CM | POA: Diagnosis not present

## 2023-08-17 DIAGNOSIS — E7800 Pure hypercholesterolemia, unspecified: Secondary | ICD-10-CM | POA: Diagnosis not present

## 2023-08-17 DIAGNOSIS — G43909 Migraine, unspecified, not intractable, without status migrainosus: Secondary | ICD-10-CM | POA: Diagnosis not present

## 2023-11-28 DIAGNOSIS — E039 Hypothyroidism, unspecified: Secondary | ICD-10-CM | POA: Diagnosis not present

## 2024-01-04 ENCOUNTER — Other Ambulatory Visit: Payer: Self-pay | Admitting: Nurse Practitioner

## 2024-01-04 ENCOUNTER — Ambulatory Visit
Admission: RE | Admit: 2024-01-04 | Discharge: 2024-01-04 | Disposition: A | Discharge status: Home / Self Care | Payer: Medicare HMO | Source: Ambulatory Visit | Attending: Nurse Practitioner | Admitting: Nurse Practitioner

## 2024-01-04 DIAGNOSIS — R195 Other fecal abnormalities: Secondary | ICD-10-CM | POA: Diagnosis not present

## 2024-01-04 DIAGNOSIS — K5909 Other constipation: Secondary | ICD-10-CM

## 2024-01-04 DIAGNOSIS — K5900 Constipation, unspecified: Secondary | ICD-10-CM | POA: Diagnosis not present

## 2024-01-04 DIAGNOSIS — R194 Change in bowel habit: Secondary | ICD-10-CM | POA: Diagnosis not present

## 2024-01-04 DIAGNOSIS — K219 Gastro-esophageal reflux disease without esophagitis: Secondary | ICD-10-CM | POA: Diagnosis not present

## 2024-01-04 DIAGNOSIS — K625 Hemorrhage of anus and rectum: Secondary | ICD-10-CM | POA: Diagnosis not present

## 2024-01-11 DIAGNOSIS — E039 Hypothyroidism, unspecified: Secondary | ICD-10-CM | POA: Diagnosis not present

## 2024-01-17 DIAGNOSIS — Z79899 Other long term (current) drug therapy: Secondary | ICD-10-CM | POA: Diagnosis not present

## 2024-01-17 DIAGNOSIS — Z6824 Body mass index (BMI) 24.0-24.9, adult: Secondary | ICD-10-CM | POA: Diagnosis not present

## 2024-01-17 DIAGNOSIS — L4050 Arthropathic psoriasis, unspecified: Secondary | ICD-10-CM | POA: Diagnosis not present

## 2024-01-17 DIAGNOSIS — L409 Psoriasis, unspecified: Secondary | ICD-10-CM | POA: Diagnosis not present

## 2024-01-17 DIAGNOSIS — M154 Erosive (osteo)arthritis: Secondary | ICD-10-CM | POA: Diagnosis not present

## 2024-02-07 DIAGNOSIS — K219 Gastro-esophageal reflux disease without esophagitis: Secondary | ICD-10-CM | POA: Diagnosis not present

## 2024-02-07 DIAGNOSIS — G43909 Migraine, unspecified, not intractable, without status migrainosus: Secondary | ICD-10-CM | POA: Diagnosis not present

## 2024-02-07 DIAGNOSIS — F419 Anxiety disorder, unspecified: Secondary | ICD-10-CM | POA: Diagnosis not present

## 2024-02-07 DIAGNOSIS — F325 Major depressive disorder, single episode, in full remission: Secondary | ICD-10-CM | POA: Diagnosis not present

## 2024-02-07 DIAGNOSIS — N1831 Chronic kidney disease, stage 3a: Secondary | ICD-10-CM | POA: Diagnosis not present

## 2024-02-07 DIAGNOSIS — Z6824 Body mass index (BMI) 24.0-24.9, adult: Secondary | ICD-10-CM | POA: Diagnosis not present

## 2024-02-07 DIAGNOSIS — I129 Hypertensive chronic kidney disease with stage 1 through stage 4 chronic kidney disease, or unspecified chronic kidney disease: Secondary | ICD-10-CM | POA: Diagnosis not present

## 2024-02-07 DIAGNOSIS — L4050 Arthropathic psoriasis, unspecified: Secondary | ICD-10-CM | POA: Diagnosis not present

## 2024-02-07 DIAGNOSIS — E039 Hypothyroidism, unspecified: Secondary | ICD-10-CM | POA: Diagnosis not present

## 2024-02-21 DIAGNOSIS — L308 Other specified dermatitis: Secondary | ICD-10-CM | POA: Diagnosis not present

## 2024-02-29 DIAGNOSIS — K5090 Crohn's disease, unspecified, without complications: Secondary | ICD-10-CM | POA: Diagnosis not present

## 2024-02-29 DIAGNOSIS — H43813 Vitreous degeneration, bilateral: Secondary | ICD-10-CM | POA: Diagnosis not present

## 2024-02-29 DIAGNOSIS — K5190 Ulcerative colitis, unspecified, without complications: Secondary | ICD-10-CM | POA: Diagnosis not present

## 2024-02-29 DIAGNOSIS — H04123 Dry eye syndrome of bilateral lacrimal glands: Secondary | ICD-10-CM | POA: Diagnosis not present

## 2024-02-29 DIAGNOSIS — H524 Presbyopia: Secondary | ICD-10-CM | POA: Diagnosis not present

## 2024-04-19 DIAGNOSIS — L308 Other specified dermatitis: Secondary | ICD-10-CM | POA: Diagnosis not present

## 2024-05-14 DIAGNOSIS — Z1231 Encounter for screening mammogram for malignant neoplasm of breast: Secondary | ICD-10-CM | POA: Diagnosis not present

## 2024-05-14 DIAGNOSIS — Z6823 Body mass index (BMI) 23.0-23.9, adult: Secondary | ICD-10-CM | POA: Diagnosis not present

## 2024-05-14 DIAGNOSIS — Z01419 Encounter for gynecological examination (general) (routine) without abnormal findings: Secondary | ICD-10-CM | POA: Diagnosis not present

## 2024-07-17 DIAGNOSIS — Z79899 Other long term (current) drug therapy: Secondary | ICD-10-CM | POA: Diagnosis not present

## 2024-07-17 DIAGNOSIS — Z6823 Body mass index (BMI) 23.0-23.9, adult: Secondary | ICD-10-CM | POA: Diagnosis not present

## 2024-07-17 DIAGNOSIS — L409 Psoriasis, unspecified: Secondary | ICD-10-CM | POA: Diagnosis not present

## 2024-07-17 DIAGNOSIS — M154 Erosive (osteo)arthritis: Secondary | ICD-10-CM | POA: Diagnosis not present

## 2024-07-17 DIAGNOSIS — L4050 Arthropathic psoriasis, unspecified: Secondary | ICD-10-CM | POA: Diagnosis not present

## 2024-08-23 DIAGNOSIS — L853 Xerosis cutis: Secondary | ICD-10-CM | POA: Diagnosis not present

## 2024-08-23 DIAGNOSIS — L2089 Other atopic dermatitis: Secondary | ICD-10-CM | POA: Diagnosis not present

## 2024-09-20 DIAGNOSIS — Z23 Encounter for immunization: Secondary | ICD-10-CM | POA: Diagnosis not present

## 2024-09-20 DIAGNOSIS — E7800 Pure hypercholesterolemia, unspecified: Secondary | ICD-10-CM | POA: Diagnosis not present

## 2024-09-20 DIAGNOSIS — I129 Hypertensive chronic kidney disease with stage 1 through stage 4 chronic kidney disease, or unspecified chronic kidney disease: Secondary | ICD-10-CM | POA: Diagnosis not present

## 2024-09-20 DIAGNOSIS — K219 Gastro-esophageal reflux disease without esophagitis: Secondary | ICD-10-CM | POA: Diagnosis not present

## 2024-09-20 DIAGNOSIS — E039 Hypothyroidism, unspecified: Secondary | ICD-10-CM | POA: Diagnosis not present

## 2024-09-20 DIAGNOSIS — J45909 Unspecified asthma, uncomplicated: Secondary | ICD-10-CM | POA: Diagnosis not present

## 2024-09-20 DIAGNOSIS — Z6823 Body mass index (BMI) 23.0-23.9, adult: Secondary | ICD-10-CM | POA: Diagnosis not present

## 2024-09-20 DIAGNOSIS — Z1211 Encounter for screening for malignant neoplasm of colon: Secondary | ICD-10-CM | POA: Diagnosis not present

## 2024-09-20 DIAGNOSIS — N1831 Chronic kidney disease, stage 3a: Secondary | ICD-10-CM | POA: Diagnosis not present

## 2024-09-20 DIAGNOSIS — G43909 Migraine, unspecified, not intractable, without status migrainosus: Secondary | ICD-10-CM | POA: Diagnosis not present

## 2024-09-20 DIAGNOSIS — Z0000 Encounter for general adult medical examination without abnormal findings: Secondary | ICD-10-CM | POA: Diagnosis not present

## 2024-09-21 DIAGNOSIS — I129 Hypertensive chronic kidney disease with stage 1 through stage 4 chronic kidney disease, or unspecified chronic kidney disease: Secondary | ICD-10-CM | POA: Diagnosis not present

## 2024-09-21 DIAGNOSIS — N1831 Chronic kidney disease, stage 3a: Secondary | ICD-10-CM | POA: Diagnosis not present

## 2024-09-26 DIAGNOSIS — Z1211 Encounter for screening for malignant neoplasm of colon: Secondary | ICD-10-CM | POA: Diagnosis not present

## 2024-10-08 DIAGNOSIS — K219 Gastro-esophageal reflux disease without esophagitis: Secondary | ICD-10-CM | POA: Diagnosis not present

## 2024-10-08 DIAGNOSIS — R195 Other fecal abnormalities: Secondary | ICD-10-CM | POA: Diagnosis not present

## 2024-10-12 DIAGNOSIS — N1831 Chronic kidney disease, stage 3a: Secondary | ICD-10-CM | POA: Diagnosis not present

## 2024-10-23 DIAGNOSIS — S0003XA Contusion of scalp, initial encounter: Secondary | ICD-10-CM | POA: Diagnosis not present

## 2024-10-23 DIAGNOSIS — R42 Dizziness and giddiness: Secondary | ICD-10-CM | POA: Diagnosis not present

## 2024-10-23 DIAGNOSIS — R110 Nausea: Secondary | ICD-10-CM | POA: Diagnosis not present

## 2024-10-23 DIAGNOSIS — W19XXXA Unspecified fall, initial encounter: Secondary | ICD-10-CM | POA: Diagnosis not present

## 2024-10-23 DIAGNOSIS — R410 Disorientation, unspecified: Secondary | ICD-10-CM | POA: Diagnosis not present

## 2024-11-13 ENCOUNTER — Other Ambulatory Visit: Payer: Self-pay | Admitting: Gastroenterology

## 2024-11-13 DIAGNOSIS — R1310 Dysphagia, unspecified: Secondary | ICD-10-CM

## 2025-01-10 ENCOUNTER — Other Ambulatory Visit
